# Patient Record
Sex: Female | Born: 1985 | Race: White | Hispanic: No | Marital: Married | Smoking: Former smoker
Health system: Southern US, Community
[De-identification: ages and names within clinical notes are randomized; demographics above are authoritative.]

## PROBLEM LIST (undated history)

## (undated) ENCOUNTER — Inpatient Hospital Stay (HOSPITAL_COMMUNITY): Payer: Self-pay

## (undated) DIAGNOSIS — Z79899 Other long term (current) drug therapy: Secondary | ICD-10-CM

## (undated) DIAGNOSIS — G43909 Migraine, unspecified, not intractable, without status migrainosus: Secondary | ICD-10-CM

## (undated) DIAGNOSIS — F1120 Opioid dependence, uncomplicated: Secondary | ICD-10-CM

## (undated) DIAGNOSIS — Z5181 Encounter for therapeutic drug level monitoring: Secondary | ICD-10-CM

## (undated) HISTORY — DX: Other long term (current) drug therapy: Z79.899

## (undated) HISTORY — PX: BREAST SURGERY: SHX581

## (undated) HISTORY — DX: Opioid dependence, uncomplicated: F11.20

## (undated) HISTORY — DX: Encounter for therapeutic drug level monitoring: Z51.81

## (undated) HISTORY — DX: Migraine, unspecified, not intractable, without status migrainosus: G43.909

---

## 2007-08-18 ENCOUNTER — Emergency Department (HOSPITAL_COMMUNITY): Admission: EM | Admit: 2007-08-18 | Discharge: 2007-08-18 | Payer: Self-pay | Admitting: Emergency Medicine

## 2008-06-13 LAB — CONVERTED CEMR LAB: Pap Smear: NORMAL

## 2008-11-05 ENCOUNTER — Ambulatory Visit: Payer: Self-pay | Admitting: Family Medicine

## 2008-11-29 ENCOUNTER — Encounter: Payer: Self-pay | Admitting: Family Medicine

## 2008-12-17 ENCOUNTER — Encounter (INDEPENDENT_AMBULATORY_CARE_PROVIDER_SITE_OTHER): Payer: Self-pay | Admitting: Plastic Surgery

## 2008-12-17 ENCOUNTER — Ambulatory Visit (HOSPITAL_BASED_OUTPATIENT_CLINIC_OR_DEPARTMENT_OTHER): Admission: RE | Admit: 2008-12-17 | Discharge: 2008-12-17 | Payer: Self-pay | Admitting: Plastic Surgery

## 2008-12-17 ENCOUNTER — Encounter: Payer: Self-pay | Admitting: Family Medicine

## 2009-05-09 ENCOUNTER — Ambulatory Visit: Payer: Self-pay | Admitting: Family Medicine

## 2009-05-09 DIAGNOSIS — Z9189 Other specified personal risk factors, not elsewhere classified: Secondary | ICD-10-CM | POA: Insufficient documentation

## 2009-05-09 DIAGNOSIS — R519 Headache, unspecified: Secondary | ICD-10-CM | POA: Insufficient documentation

## 2009-05-09 DIAGNOSIS — F17200 Nicotine dependence, unspecified, uncomplicated: Secondary | ICD-10-CM

## 2009-05-09 DIAGNOSIS — R51 Headache: Secondary | ICD-10-CM

## 2009-06-09 ENCOUNTER — Ambulatory Visit: Payer: Self-pay | Admitting: Family Medicine

## 2009-06-09 DIAGNOSIS — G479 Sleep disorder, unspecified: Secondary | ICD-10-CM

## 2009-09-25 ENCOUNTER — Telehealth: Payer: Self-pay | Admitting: Family Medicine

## 2009-10-08 ENCOUNTER — Telehealth: Payer: Self-pay | Admitting: Family Medicine

## 2009-10-10 ENCOUNTER — Ambulatory Visit: Payer: Self-pay | Admitting: Family Medicine

## 2010-04-14 NOTE — Progress Notes (Signed)
Summary: migraines   Phone Note Call from Patient Call back at Home Phone [phone]   Caller: Patient Call For: Hannah Beat MD Summary of Call: Patient was seen back in march with having migraines. She was given niproxen. Patient said that it is not helping at all, still having headaches almost daily. She wants to know if she could have something else called in to [facility].  Initial call taken by: Melody Comas,  September 25, 2009 10:48 AM  Follow-up for Phone Call        Appears she was also given amitryptiline for HA as well (a daily med)  OK to call in below  follow-up next week if not getting better Follow-up by: Hannah Beat MD,  September 25, 2009 2:07 PM    New/Updated Medications: BUTALBITAL-APAP-CAFFEINE 50-325-40 MG TABS (BUTALBITAL-APAP-CAFFEINE) 2 by mouth at onset of headache, then 1 by mouth q 4 hours, max 8/24 hours Prescriptions: BUTALBITAL-APAP-CAFFEINE 50-325-40 MG TABS (BUTALBITAL-APAP-CAFFEINE) 2 by mouth at onset of headache, then 1 by mouth q 4 hours, max 8/24 hours  #30 x 0   Entered by:   Benny Lennert CMA (AAMA)   Authorized by:   Hannah Beat MD   Signed by:   Benny Lennert CMA (AAMA) on 09/25/2009   Method used:   [facility]        [facility]  [address] [id number]       [address]       [place], [place]  [address]       Ph: [phone] or [phone]       Fax: [phone]   RxID:   [id number] BUTALBITAL-APAP-CAFFEINE 50-325-40 MG TABS (BUTALBITAL-APAP-CAFFEINE) 2 by mouth at onset of headache, then 1 by mouth q 4 hours, max 8/24 hours  #30 x 0   Entered and Authorized by:   Hannah Beat MD   Signed by:   Hannah Beat MD on 09/25/2009   Method used:   Telephoned to ...         RxID:   [id number]

## 2010-04-14 NOTE — Assessment & Plan Note (Signed)
Summary: COUGH, EARS HURTING   Vital Signs:  Patient profile:   25 year old female Height:      63 inches Weight:      164.75 pounds BMI:     29.29 Temp:     98.9 degrees F oral Pulse rate:   64 / minute Pulse rhythm:   regular Resp:     20 per minute BP sitting:   116 / 74  (left arm) Cuff size:   regular  Vitals Entered By: Lewanda Rife LPN (May 09, 2009 3:47 PM) 164  History of Present Illness: sick for a week  started up with bad st and cough now ears are bothering her - painful and full no fever - no chills or aches  both runny and stuffy nose with yellow d/c   is coughing up yellow stuff is a smoker is wheezing and no inhalers    no n/v/d   has been having some headaches all over -- going on for a while too  is both sides of her head about every day -- takes tylenol  going on for a couple of months  no hx of migraines   had breast reduction -- Dr Jenelle Mages  has one more f/u with her for f/u    Allergies (verified): No Known Drug Allergies  Past History:  Past Medical History: Last updated: 11/05/2008 Urinary tract infections  Past Surgical History: Last updated: 11/05/2008 none  Family History: Last updated: 11/05/2008 Family History of Alcoholism/Addiction Family History of Arthritis Family History Depression Family History Hypertension  Social History: Last updated: 11/05/2008 Occupation:nursing student Single Current Smoker Alcohol use-yes Drug use-no Regular exercise-no  Risk Factors: Exercise: no (11/05/2008)  Risk Factors: Smoking Status: current (11/05/2008)  Review of Systems General:  Complains of fatigue and malaise; denies chills and fever. Eyes:  Denies blurring, discharge, and eye irritation. ENT:  Complains of nasal congestion, postnasal drainage, and sore throat. CV:  Denies chest pain or discomfort and palpitations. Resp:  Complains of cough and sputum productive; denies shortness of breath and  wheezing. GI:  Denies abdominal pain, bloody stools, and change in bowel habits. Derm:  Denies itching, lesion(s), poor wound healing, and rash. Neuro:  Complains of headaches; denies falling down, numbness, poor balance, sensation of room spinning, tingling, visual disturbances, and weakness. Psych:  Denies anxiety and depression. Endo:  Denies excessive thirst and excessive urination.  Physical Exam  General:  fatigued appearing  Head:  normocephalic, atraumatic, and no abnormalities observed.  no sinus tenderness  Eyes:  vision grossly intact, pupils equal, pupils round, and pupils reactive to light.  mild conj injection without d/c  Ears:  L ear erythema and bulging  R ear - effusion   Nose:  nares congested and injected  Mouth:  pharynx pink and moist, no erythema, and no exudates.   Neck:  No deformities, masses, or tenderness noted. Lungs:  CTA with harsh bs at bases  some rhonchi but no rales or crackles scant exp wheeze at bases worse on forced exp Heart:  Normal rate and regular rhythm. S1 and S2 normal without gallop, murmur, click, rub or other extra sounds. Abdomen:  Bowel sounds positive,abdomen soft and non-tender without masses, organomegaly or hernias noted. Neurologic:  cranial nerves II-XII intact, sensation intact to light touch, gait normal, and DTRs symmetrical and normal.   Skin:  Intact without suspicious lesions or rashes Cervical Nodes:  No lymphadenopathy noted Psych:  fatigued but nl affect   Impression & Recommendations:  Problem # 1:  OTITIS MEDIA, ACUTE (ICD-382.9) Assessment New on L with ETD on R / s/p uri also with some bronchitis in smoker will cover with augmentin (did adv to use back up preg prev with this in light of interaction with OC and pt voiced understanding) fluid/rest and robitussin ac for cough with caution for sedation  update if worse or not imp in 1 week adv to work on smoking cessation Her updated medication list for this  problem includes:    Augmentin 875-125 Mg Tabs (Amoxicillin-pot clavulanate) .Marland Kitchen... 1 by mouth two times a day for 10 days for ear infection and bronchitis  Problem # 2:  BRONCHITIS- ACUTE (ICD-466.0) Assessment: New see above- in assoc with uri in a smoker -- cover with augmentin also mdi albuterol as needed wheeze - need to watch for worse reactive airways and rev red flags  adv to quit smoking  see above for coverage  Her updated medication list for this problem includes:    Augmentin 875-125 Mg Tabs (Amoxicillin-pot clavulanate) .Marland Kitchen... 1 by mouth two times a day for 10 days for ear infection and bronchitis    Guaifenesin-codeine 100-10 Mg/63ml Syrp (Guaifenesin-codeine) .Marland Kitchen... 1-2 teaspoons by mouth up to every 4-6 hours as needed cough    Proair Hfa 108 (90 Base) Mcg/act Aers (Albuterol sulfate) .Marland Kitchen... 2 puffs up to every 4 hours as needed wheezing or tight chest  Problem # 3:  TOBACCO USE (ICD-305.1) Assessment: Comment Only discussed in detail risks of smoking, and possible outcomes including COPD, vascular dz, cancer and also respiratory infections/sinus problems  pt voiced understanding  did adv to quit   Problem # 4:  REDUCTION MAMMOPLASTY, HX OF (ICD-V15.9) Assessment: Comment Only ref to surgeon for 6 mo re check- doing very well  Orders: Surgical Referral (Surgery)  Problem # 5:  HEADACHE, CHRONIC (ICD-784.0) Assessment: New did not have time for full ha workup today- but disc imp of regular sleep /water/ limit caff and analgesics could be rel to uri also disc poss of change in birth control being trigger- adv to f/u with gyn to disc going back on her prev pill lo ovrol enc to f/u if this does not help so headache can be eval further   Complete Medication List: 1)  Loestrin 24 Fe 1-20 Mg-mcg Tabs (Norethin ace-eth estrad-fe) .... Take one daily 2)  Augmentin 875-125 Mg Tabs (Amoxicillin-pot clavulanate) .Marland Kitchen.. 1 by mouth two times a day for 10 days for ear infection and  bronchitis 3)  Guaifenesin-codeine 100-10 Mg/84ml Syrp (Guaifenesin-codeine) .Marland Kitchen.. 1-2 teaspoons by mouth up to every 4-6 hours as needed cough 4)  Proair Hfa 108 (90 Base) Mcg/act Aers (Albuterol sulfate) .... 2 puffs up to every 4 hours as needed wheezing or tight chest  Patient Instructions: 1)  drink lots of fluids 2)  take the augmentin as directed 3)  consider quitting smoking 4)  use the proair inhaler as needed for wheeze or tight chest  5)  use the cough syrup with caution because it can sedate  6)  update me if worse or not improving in a week  7)  call your gyn about switching back to old birth control pill in light of headaches  Prescriptions: PROAIR HFA 108 (90 BASE) MCG/ACT AERS (ALBUTEROL SULFATE) 2 puffs up to every 4 hours as needed wheezing or tight chest  #1 mdi x 1   Entered and Authorized by:   Judith Part MD   Signed by:   [employer]  Rose Fillers MD on 05/09/2009   Method used:   Print then Give to Patient   RxID:   [id number] GUAIFENESIN-CODEINE 100-10 MG/5ML SYRP (GUAIFENESIN-CODEINE) 1-2 teaspoons by mouth up to every 4-6 hours as needed cough  #120cc x 0   Entered and Authorized by:   Judith Part MD   Signed by:   Judith Part MD on 05/09/2009   Method used:   Print then Give to Patient   RxID:   [id number] AUGMENTIN 875-125 MG TABS (AMOXICILLIN-POT CLAVULANATE) 1 by mouth two times a day for 10 days for ear infection and bronchitis  #20 x 0   Entered and Authorized by:   Judith Part MD   Signed by:   Judith Part MD on 05/09/2009   Method used:   Print then Give to Patient   RxID:   [id number]   Current Allergies (reviewed today): No known allergies

## 2010-04-14 NOTE — Op Note (Signed)
Summary: Breast Reduction/[facility]  Breast Reduction/[facility]   Imported By: Lanelle Bal 05/15/2009 11:15:50  _____________________________________________________________________  External Attachment:    Type:   Image     Comment:   External Document

## 2010-04-14 NOTE — Letter (Signed)
Summary: Page 1 Only/[facility]  Page 1 Only/[facility]   Imported By: Lanelle Bal 05/15/2009 11:05:34  _____________________________________________________________________  External Attachment:    Type:   Image     Comment:   External Document

## 2010-04-14 NOTE — Assessment & Plan Note (Signed)
Summary: MIGRAINE HA/CLE   Vital Signs:  Patient profile:   25 year old female Height:      63 inches Weight:      164.2 pounds BMI:     29.19 Temp:     98.5 degrees F oral Pulse rate:   72 / minute Pulse rhythm:   regular BP sitting:   110 / 70  (left arm) Cuff size:   regular  Vitals Entered By: Benny Lennert CMA Duncan Dull) (October 10, 2009 9:36 AM)  History of Present Illness: Chief complaint Migraine Headache  25 year old female:  Ha almost every day. For about 8 months. Some days will be a whole lt days that others.   took some  light bothers home - the fiorecet.     Headache HPI:      The patient comes in for chronic management of headaches which have been unstable.  Since the last visit, the frequency of headaches have increased, but the intensity of the headaches have not changed.  The headaches will last anywhere from 2 hours to 3 days at a time.  She has approximately 5+ headaches per month.  There is a family history of migraine headaches.        The location of the headaches are bilateral.  Headache quality is throbbing or pulsating.  Precipitating factors consist of stress.  The headaches are associated with nausea, photophobia, and phonophobia.        Positive alarm features include change in frequency from prior H/A's.  The patient denies first or worst H/A of life, change in features from prior H/A's, mylagia, fever, malaise, weight loss, scalp tenderness, focal neurologic symptoms, confusion, seizures, and impaired level of consciousness.         Headache Treatment History:      NSAID's and tricyclic antidepressants were tried but not effective.  She has tried acetaminophen-plain, ASA-plain, ASA-butalbital-caffeine, naproxen, ibuprofen, and other NSAID which were ineffective.    Allergies (verified): No Known Drug Allergies  Past History:  Past medical, surgical, family and social histories (including risk factors) reviewed, and no changes noted (except as noted  below).  Past Medical History: Reviewed history from 06/09/2009 and no changes required. Urinary tract infections migraine with chronic daily headache   Past Surgical History: Reviewed history from 11/05/2008 and no changes required. none  Family History: Reviewed history from 06/09/2009 and no changes required. Family History of Alcoholism/Addiction Family History of Arthritis Family History Depression Family History Hypertension great aunt - lung cancer   Social History: Reviewed history from 11/05/2008 and no changes required. Occupation:nursing student Single Current Smoker Alcohol use-yes Drug use-no Regular exercise-no  Review of Systems      See HPI General:  Denies chills, fatigue, and fever.  Physical Exam  General:  GEN: Well-developed,well-nourished,in no acute distress; alert,appropriate and cooperative throughout examination HEENT: Normocephalic and atraumatic without obvious abnormalities. No apparent alopecia or balding. Ears, externally no deformities PULM: Breathing comfortably in no respiratory distress EXT: No clubbing, cyanosis, or edema PSYCH: Normally interactive. Cooperative during the interview. Pleasant. Friendly and conversant. Not anxious or depressed appearing. Normal, full affect.    Detailed Neurologic Exam  Speech:    Speech is normal; fluent and spontaneous with normal comprehension Cognition:    The patient is oriented to person, place, and time; memory intact; language fluent; normal attention, concentration, and fund of knowledge Cranial Nerves:    The pupils are equal, round, and reactive to light. The fundi are normal and spontaneous  venous pulsations are present. Visual fields are full to finger confrontation. Extraocular movements are intact. Trigeminal sensation is intact and the muscles of mastication are normal. The face is symmetric. The palate elevates in the midline. Voice is normal. Shoulder shrug is normal. The tongue has  normal motion without fasciculations.  Coordination:    Normal finger to nose and heel to shin. Normal rapid alternating movements.  Gait:    Heel-toe and tandem gait are normal.  Trapezius:    No tightness or tenderness noted.  Observation:    No asymmetry, no atrophy, and no involuntary movements noted.   Tone:    Normal muscle tone.  Posture:    Posture is normal.  Strength:    Strength is V/V in the upper and lower limbs.    Impression & Recommendations:  Problem # 1:  HEADACHE, CHRONIC (ICD-784.0) mixed component of headache, likely migraine by history. Phonophobia and photophobia. However the she is having daily headaches, and a component of analgesic rebound is likely. I've asked her to titrate off of her emergency medications.  Failure of try cyclic antidepressant. Start beta blocker. Also start riboflavin.  The following medications were removed from the medication list:    Naprosyn 500 Mg Tabs (Naproxen) .Marland Kitchen... 1 by mouth up to two times a day with food as needed headache Her updated medication list for this problem includes:    Butalbital-apap-caffeine 50-325-40 Mg Tabs (Butalbital-apap-caffeine) .Marland Kitchen... 2 by mouth at onset of headache, then 1 by mouth q 4 hours, max 8/24 hours    Propranolol Hcl 40 Mg Tabs (Propranolol hcl) .Marland Kitchen... 1 by mouth two times a day    Imitrex 100 Mg Tabs (Sumatriptan succinate) .Marland Kitchen... 1 by mouth at the onset of migraine. may repeat dose in one hour if headache not resolved  Problem # 2:  SLEEP DISORDER (ICD-780.50) Assessment: Improved primarily due to #1  Complete Medication List: 1)  Butalbital-apap-caffeine 50-325-40 Mg Tabs (Butalbital-apap-caffeine) .... 2 by mouth at onset of headache, then 1 by mouth q 4 hours, max 8/24 hours 2)  Propranolol Hcl 40 Mg Tabs (Propranolol hcl) .Marland Kitchen.. 1 by mouth two times a day 3)  Imitrex 100 Mg Tabs (Sumatriptan succinate) .Marland Kitchen.. 1 by mouth at the onset of migraine. 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 May repeat dose in one hour if headache not resolved  #10 x 3   Entered and Authorized by:   Hannah Beat MD   Signed by:   Hannah Beat MD on 10/10/2009   Method used:   Print then Give to Patient   RxID:   [id number] PROPRANOLOL HCL 40 MG TABS (PROPRANOLOL HCL) 1 by mouth two times a day  #60 x 5   Entered and Authorized by:   Hannah Beat MD   Signed by:   Hannah Beat MD on 10/10/2009   Method used:   Print then Give to Patient   RxID:   [id number]   Current Allergies (reviewed today): No known allergies

## 2010-04-14 NOTE — Assessment & Plan Note (Signed)
Summary: migraines/ alc   Vital Signs:  Patient profile:   25 year old female Height:      63 inches Weight:      169.75 pounds BMI:     30.18 Temp:     99.1 degrees F oral Pulse rate:   80 / minute Pulse rhythm:   regular BP sitting:   116 / 82  (left arm) Cuff size:   regular  Vitals Entered By: Lewanda Rife LPN (June 09, 2009 10:21 AM) CC: Wants to discuss migraine h/a and trouble sleeping. No h/a today   History of Present Illness: last time we talked about headaches -- disc OC  has not been taking her OC for 2 mo -- and headaches are still bad  also not sleeping well for 4-5 month   no real increase in stress  diet not too bad  no caffine  exercise -- runs 2-3 per week -- 11/2 miles   having headaches almost every day  all over - esp in forehead very light sensitive  is bilateral  not sound sensitive  no aura at all  it throbs , sometimes worse with exertion   not worse around menses at all   no particular trigger   usually start when she wakes up - lasts all day  get 8/10 pain scale -- has had to leave work tylenol does not help  no other meds   no vision problems at all  is good about water intake generally    no chance pregnant at all   no migraines in family   Allergies (verified): No Known Drug Allergies  Past History:  Past Surgical History: Last updated: 11/05/2008 none  Family History: Last updated: 06/09/2009 Family History of Alcoholism/Addiction Family History of Arthritis Family History Depression Family History Hypertension great aunt - lung cancer   Social History: Last updated: 11/05/2008 Occupation:nursing student Single Current Smoker Alcohol use-yes Drug use-no Regular exercise-no  Risk Factors: Exercise: no (11/05/2008)  Risk Factors: Smoking Status: current (11/05/2008)  Past Medical History: Urinary tract infections migraine with chronic daily headache   Family History: Family History of  Alcoholism/Addiction Family History of Arthritis Family History Depression Family History Hypertension great aunt - lung cancer   Review of Systems General:  Complains of fatigue; denies fever, loss of appetite, malaise, and weight loss. Eyes:  Denies blurring and eye irritation. CV:  Denies chest pain or discomfort, lightheadness, palpitations, and shortness of breath with exertion. Resp:  Denies cough, shortness of breath, and wheezing. GI:  Denies abdominal pain, bloody stools, change in bowel habits, nausea, and vomiting. GU:  Denies dysuria and urinary frequency. MS:  Denies joint pain, muscle aches, cramps, and stiffness. Derm:  Denies itching, lesion(s), poor wound healing, and rash. Neuro:  Complains of headaches; denies difficulty with concentration, disturbances in coordination, falling down, memory loss, numbness, poor balance, sensation of room spinning, tingling, tremors, visual disturbances, and weakness. Psych:  Denies anxiety and depression. Endo:  Denies cold intolerance, excessive thirst, excessive urination, and heat intolerance. Heme:  Denies abnormal bruising and bleeding. Allergy:  Denies seasonal allergies.  Physical Exam  General:  fatigued appearing  Head:  normocephalic, atraumatic, and no abnormalities observed.  no sinus tenderness  Eyes:  vision grossly intact, pupils equal, pupils round, and pupils reactive to light.  fundi grossly wnl no nystagmus  Ears:  R ear normal and L ear normal.   Nose:  no nasal discharge.   Mouth:  pharynx pink and moist, no  erythema, and no exudates.   Neck:  supple with full rom and no masses or thyromegally, no JVD or carotid bruit  Chest Wall:  No deformities, masses, or tenderness noted. Lungs:  diffusely distant bs - CTA Heart:  Normal rate and regular rhythm. S1 and S2 normal without gallop, murmur, click, rub or other extra sounds. Abdomen:  Bowel sounds positive,abdomen soft and non-tender without masses, organomegaly  or hernias noted. Msk:  No deformity or scoliosis noted of thoracic or lumbar spine.  no acute joint changes  Extremities:  No clubbing, cyanosis, edema, or deformity noted with normal full range of motion of all joints.   Neurologic:  alert & oriented X3, cranial nerves II-XII intact, strength normal in all extremities, sensation intact to light touch, gait normal, DTRs symmetrical and normal, finger-to-nose normal, toes down bilaterally on Babinski, and Romberg negative.   Skin:  Intact without suspicious lesions or rashes Cervical Nodes:  No lymphadenopathy noted Psych:  seems fatigued but pleasant not seemingly depressed or anxious good eye contact and comm skills   Impression & Recommendations:  Problem # 1:  HEADACHE, CHRONIC (ICD-784.0) Assessment Deteriorated chronic daily headache suspect migraine- may be some tylenol analgesic rebound  many features of migraine - not hormonal  also insomnia will try elavil- slow taper up for sleep and ha-- disc poss side eff incl SI or dep- will alert me  disc lifestyle change and given handout from [facility] as well naproxen for acute headache with food- may try tryptan in future  update if worse f/u in 1 mo  Her updated medication list for this problem includes:    Naprosyn 500 Mg Tabs (Naproxen) .Marland Kitchen... 1 by mouth up to two times a day with food as needed headache  Problem # 2:  SLEEP DISORDER (ICD-780.50) Assessment: New disc sleep hygiene and exercise trial of elavil- see above- with gradual inc in dose as tol  f/u 1 mo   Problem # 3:  TOBACCO USE (ICD-305.1) Assessment: Unchanged discussed in detail risks of smoking, and possible outcomes including COPD, vascular dz, cancer and also respiratory infections/sinus problems   also added this may worsen headache  pt voiced understanding  she wants to try to quit   Complete Medication List: 1)  Proair Hfa 108 (90 Base) Mcg/act Aers (Albuterol sulfate) .... 2 puffs up to every 4 hours as  needed wheezing or tight chest 2)  Amitriptyline Hcl 10 Mg Tabs (Amitriptyline hcl) .... Take 3 by mouth at bedtime 3)  Naprosyn 500 Mg Tabs (Naproxen) .Marland Kitchen.. 1 by mouth up to two times a day with food as needed headache  Patient Instructions: 1)  start amitriptyline 1 pill each bedtime for 1 week 2)  then 2 pills each bedtme for 1 week 3)  then increase to 3 pills each bedtime 4)  at any time if this makes you too sedated -- stay at the lower dose  5)  this should help sleep and headaches 6)  try the naproxen for headaches  7)  be good with your water intake  8)  avoid alcohol 9)  work on smoking  10)  follow up with me in about a month  Prescriptions: NAPROSYN 500 MG TABS (NAPROXEN) 1 by mouth up to two times a day with food as needed headache  #60 x 1   Entered and Authorized by:   Judith Part MD   Signed by:   Judith Part MD on 06/09/2009   Method used:  [facility]        [facility]  [address] [facility]       [address]       [place], [place]  [address]       Ph: [phone] or [phone]       Fax: [phone]   RxID:   [id number] AMITRIPTYLINE HCL 10 MG TABS (AMITRIPTYLINE HCL) take 3 by mouth at bedtime  #90 x 2   Entered and Authorized by:   Judith Part MD   Signed by:   Judith Part MD on 06/09/2009   Method used:   [facility]        [facility]  [address] (retail)       [address]       [place], [place]  [address]       Ph: [phone] or [phone]       Fax: [phone]   RxID:   [id number]   Current Allergies (reviewed today): No known allergies

## 2010-04-14 NOTE — Progress Notes (Signed)
Summary: refill request for butalbital  Phone Note Refill Request Message from:  Fax from Pharmacy  Refills Requested: Medication #1:  BUTALBITAL-APAP-CAFFEINE 50-325-40 MG TABS 2 by mouth at onset of headache   Last Refilled: 09/25/2009 [facility] [address], [phone].  Initial call taken by: Lowella Petties CMA,  October 08, 2009 11:37 AM  Follow-up for Phone Call        ok #30  needs follow-up please schedule, should not be taking this much. Follow-up by: Hannah Beat MD,  October 08, 2009 11:45 AM  Additional Follow-up for Phone Call Additional follow up Details #1::        rx called to pharmacy and patient advised to make follow up appt for headaches Additional Follow-up by: Benny Lennert CMA Duncan Dull),  October 09, 2009 11:10 AM    Prescriptions: BUTALBITAL-APAP-CAFFEINE 50-325-40 MG TABS (BUTALBITAL-APAP-CAFFEINE) 2 by mouth at onset of headache, then 1 by mouth q 4 hours, max 8/24 hours  #30 x 0   Entered by:   Benny Lennert CMA (AAMA)   Authorized by:   Hannah Beat MD   Signed by:   Benny Lennert CMA (AAMA) on 10/09/2009   Method used:   [facility]        [facility]  [address] (retail)       [address]       [place], [place]  [address]       Ph: [phone] or [phone]       Fax: [phone]   RxID:   [id number]

## 2010-06-18 LAB — POCT HEMOGLOBIN-HEMACUE: Hemoglobin: 14 g/dL (ref 12.0–15.0)

## 2011-06-23 ENCOUNTER — Other Ambulatory Visit: Payer: Self-pay | Admitting: Obstetrics and Gynecology

## 2011-10-13 ENCOUNTER — Encounter: Payer: Self-pay | Admitting: Family Medicine

## 2011-10-13 ENCOUNTER — Ambulatory Visit (INDEPENDENT_AMBULATORY_CARE_PROVIDER_SITE_OTHER): Admitting: Family Medicine

## 2011-10-13 VITALS — BP 100/68 | HR 68 | Temp 98.6°F | Wt 159.0 lb

## 2011-10-13 DIAGNOSIS — N912 Amenorrhea, unspecified: Secondary | ICD-10-CM

## 2011-10-13 DIAGNOSIS — Z3490 Encounter for supervision of normal pregnancy, unspecified, unspecified trimester: Secondary | ICD-10-CM

## 2011-10-13 DIAGNOSIS — Z331 Pregnant state, incidental: Secondary | ICD-10-CM

## 2011-10-13 LAB — POCT URINE PREGNANCY: Preg Test, Ur: POSITIVE

## 2011-10-13 MED ORDER — PRENATAL VITAMINS (DIS) PO TABS: ORAL_TABLET | ORAL | Status: DC

## 2011-10-13 NOTE — Patient Instructions (Addendum)
REFERRAL: GO THE THE FRONT ROOM AT THE ENTRANCE OF OUR CLINIC, NEAR CHECK IN. ASK FOR MARION. SHE WILL HELP YOU SET UP YOUR REFERRAL. DATE: TIME:  

## 2011-10-13 NOTE — Progress Notes (Signed)
   Nature conservation officer at [facility] [address] [place] [address] Phone: [phone] Fax: [phone]  Date:  10/13/2011   Name:  Darlene Baker   DOB:  1986/02/23   MRN:  [id number]  PCP:  Hannah Beat, MD    Chief Complaint: Amenorrhea and Positive home pregnancy test, needs referral to OB   History of Present Illness:  Darlene Baker is a 26 y.o. very pleasant female patient who presents with the following:  LMP 08/22/2011. 1st pregnancy.  Already sees Dr. 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   Prenatal vitamins Dr. Duane Lope at [facility]  Past Medical History, Surgical History, Social History, Family History, Problem List, Medications, and Allergies have been reviewed and updated if relevant.  No current outpatient prescriptions on file prior to visit.    Review of Systems:  GEN: No acute illnesses, no fevers, chills. GI: No n/v/d, eating normally - minimal nausea at this point Pulm: No SOB Interactive and getting along well at home.  Otherwise, ROS is as per the HPI.   Physical Examination: Filed Vitals:   10/13/11 1609  BP: 100/68  Pulse: 68  Temp: 98.6 F (37 C)   Filed Vitals:   10/13/11 1609  Weight: 159 lb (72.122 kg)   There is no height on file to calculate BMI. Ideal Body Weight:     GEN: WDWN, NAD, Non-toxic, A & O x 3 HEENT: Atraumatic, Normocephalic. Neck supple. No masses, No LAD. Ears and Nose: No external deformity. CV: RRR, No M/G/R. No JVD. No thrill. No extra heart sounds. PULM: CTA B, no wheezes, crackles, rhonchi. No retractions. No resp. distress. No accessory muscle use. EXTR: No c/c/e NEURO Normal gait.  PSYCH: Normally interactive. Conversant. Not depressed or anxious appearing.  Calm demeanor.    Assessment and Plan: 1. Amenorrhea  POCT urine pregnancy  2. Pregnancy  Ambulatory referral to Obstetrics / Gynecology    Lab Results    Component Value Date   PREGTESTUR Positive 10/13/2011   Consult [facility] PNV written for  Hannah Beat, MD

## 2011-11-03 ENCOUNTER — Other Ambulatory Visit: Payer: Self-pay

## 2011-12-13 ENCOUNTER — Other Ambulatory Visit (HOSPITAL_COMMUNITY): Payer: Self-pay | Admitting: Obstetrics & Gynecology

## 2011-12-13 DIAGNOSIS — Z3689 Encounter for other specified antenatal screening: Secondary | ICD-10-CM

## 2011-12-13 DIAGNOSIS — R772 Abnormality of alphafetoprotein: Secondary | ICD-10-CM

## 2011-12-13 DIAGNOSIS — IMO0001 Reserved for inherently not codable concepts without codable children: Secondary | ICD-10-CM

## 2011-12-16 ENCOUNTER — Other Ambulatory Visit (HOSPITAL_COMMUNITY): Payer: Self-pay | Admitting: Obstetrics & Gynecology

## 2011-12-16 ENCOUNTER — Ambulatory Visit (HOSPITAL_COMMUNITY)
Admission: RE | Admit: 2011-12-16 | Discharge: 2011-12-16 | Disposition: A | Discharge status: Home / Self Care | Source: Ambulatory Visit | Attending: Obstetrics & Gynecology | Admitting: Obstetrics & Gynecology

## 2011-12-16 ENCOUNTER — Ambulatory Visit (HOSPITAL_COMMUNITY): Admission: RE | Admit: 2011-12-16 | Source: Ambulatory Visit

## 2011-12-16 ENCOUNTER — Encounter (HOSPITAL_COMMUNITY): Payer: Self-pay

## 2011-12-16 VITALS — BP 111/72 | HR 67 | Wt 158.2 lb

## 2011-12-16 DIAGNOSIS — IMO0001 Reserved for inherently not codable concepts without codable children: Secondary | ICD-10-CM

## 2011-12-16 DIAGNOSIS — O99330 Smoking (tobacco) complicating pregnancy, unspecified trimester: Secondary | ICD-10-CM | POA: Insufficient documentation

## 2011-12-16 DIAGNOSIS — Z3689 Encounter for other specified antenatal screening: Secondary | ICD-10-CM

## 2011-12-16 DIAGNOSIS — IMO0002 Reserved for concepts with insufficient information to code with codable children: Secondary | ICD-10-CM

## 2011-12-16 DIAGNOSIS — R772 Abnormality of alphafetoprotein: Secondary | ICD-10-CM

## 2011-12-16 DIAGNOSIS — Z1389 Encounter for screening for other disorder: Secondary | ICD-10-CM | POA: Insufficient documentation

## 2011-12-16 DIAGNOSIS — O30039 Twin pregnancy, monochorionic/diamniotic, unspecified trimester: Secondary | ICD-10-CM

## 2011-12-16 DIAGNOSIS — O358XX0 Maternal care for other (suspected) fetal abnormality and damage, not applicable or unspecified: Secondary | ICD-10-CM | POA: Insufficient documentation

## 2011-12-16 DIAGNOSIS — Z363 Encounter for antenatal screening for malformations: Secondary | ICD-10-CM | POA: Insufficient documentation

## 2011-12-16 DIAGNOSIS — O30009 Twin pregnancy, unspecified number of placenta and unspecified number of amniotic sacs, unspecified trimester: Secondary | ICD-10-CM | POA: Insufficient documentation

## 2011-12-16 NOTE — Progress Notes (Signed)
[facility] ultrasound  Indication: 26 yr old G1P0 at [redacted]w[redacted]d with monochorionic/diamniotic twin gestation and elevated MSAFP for ultrasound.  Findings: 1. Monochorionic/diamniotic twin gestation; the dividing membrane is seen. 2. Anterior placenta without evidence of previa. 3. Normal amniotic fluid volume for both fetuses; however the maximum vertical pocket of amniotic fluid for twin A is 2.64 (slightly decreased); and 6.2cm for twin B (slightly increased). 4. Normal transvaginal cervical length. 5. There is an abdominal wall defect in twin A consistent with gastroschisis. 6. The remainder of the limited anatomy survey for both fetuses is normal; the anatomic survey is limited by early gestational age. Normal stomach and bladder seen in each fetus. 7. Normal umbilical artery Doppler studies for both fetuses.  Recommendations:  1. Monochorionic/diamniotic twin pregnancy: see consult letter. No evidence of twin twin transfusion syndrome at this time; however there is concern for possible development given discordant amniotic fluid volume. Recommend fetal growth every 4 weeks and ultrasounds at least every 2 weeks to evaluate for twin twin transfusion syndrome. However given discordant amniotic fluid volumes at this time recommend follow up ultrasound on 12/21/11. If fluid volumes improve or remain stable can evaluate weekly and then may be able to space to every 2 weeks if remains normal. Recommend transvaginal cervical lengths every 2 weeks. Recommend preterm labor precautions, recommend antenatal testing starting at 32 weeks, recommend delivery by [redacted] weeks gestation. See consult for further recommendations.  2. Twin A gastroschisis: discussed this finding is likely the etiology of the elevated MSAFP. See consult letter. Recommend fetal growth every 4 weeks, recommend antenatal testing starting at 32 weeks, recommend antenatal consult with Pediatric Surgery- referral made.  3. Follow up ultrasound on  12/21/11.  Results given to Dr. Marzetta Board nurse. Eulis Foster, MD

## 2011-12-16 NOTE — Progress Notes (Signed)
MFM consult  26 yr old G1P0 at [redacted]w[redacted]d with monochorionic/diamniotic twin gestation and elevated MSAFP referred by Dr. Arlyce Dice for ultrasound and consultation.    Ultrasound today shows: monochorionic/diamniotic twin gestation; the dividing membrane is seen. Anterior placenta without evidence of previa.  Normal amniotic fluid volume for both fetuses; however the maximum vertical pocket of amniotic fluid for twin A is 2.64 (slightly decreased); and 6.2cm for twin B (slightly increased). Normal transvaginal cervical length.  There is an abdominal wall defect in twin A consistent with gastroschisis. The remainder of the limited anatomy survey for both fetuses is normal; the anatomic survey is limited by early gestational age. Normal stomach and bladder seen in each fetus.  Normal umbilical artery Doppler studies for both fetuses.    I discussed the findings of the ultrasound with the patient and counseled her as follows:  1. Monochorionic/diamniotic twin pregnancy:   I discussed the following increased risks with monochorionic/diamniotic twin gestation: - risk of preterm delivery: discussed risk of delivering prior to 37 weeks is approximately 60%; risk of delivering prior to 32 weeks is approximately 11%-  Preterm delivery may be spontaneous or iatrogenic from complications of pregnancy - I recommend close monitoring for the development of signs/symptoms of preterm labor/PPROM - recommend serial cervical length surveillance every 2-4 weeks  -  Increased risk of gestational diabetes: recommend screening at 24 weeks - Discussed increased risk of preeclampsia: recommend close surveillance for the development of signs/symptoms of preeclampsia in the 2nd and 3rd trimester - Discussed the increased risk of requiring a Cesarean delivery -  Discussed risk of fetal growth restriction: recommend serial fetal growth ultrasounds every 2-4 weeks -  Discussed increased risk of congenital anomalies of approximately  3-6%: recommend complete fetal anatomic survey at 18-[redacted] weeks gestation; gastroschisis seen in twin A today -  Patient had normal first trimester screen; discussed limitations of screening tests in detecting fetal aneuploidy; had elevated AFP- see below - Discussed increased risk of fetal death: recommend antenatal testing starting at [redacted] weeks gestation; recommend delivery at 34-[redacted] weeks gestation if pregnancy remains uncomplicated - Discussed risk of twin twin transfusion syndrome is approximately 10-15%. Briefly discussed if this were to develop there are potential interventions such as amnioreduction and laser therapy. No evidence of twin twin transfusion syndrome at this time; however there is concern for possible development given discordant amniotic fluid volume. Recommend ultrasounds at least every 2 weeks to evaluate for twin twin transfusion syndrome. However given discordant amniotic fluid volumes at this time recommend follow up ultrasound on 12/21/11. If fluid volumes improve or remain stable can evaluate weekly and then may be able to space to every 2 weeks if remains normal.     2. Twin A gastroschisis: discussed this finding is likely the etiology of the elevated MSAFP. Discussed that gastroschisis is usually an isolated event that is not usually associated with aneuploidy or other anomalies. There is likely no increased risk of recurrence in future pregnancies. Again reiterated that gastroschisis is not associated with aneuploidy but given there is an anomaly and elevated MSAFP I discussed benefits/risks of amniocentesis including risks of bleeding, infection, rupture of membranes, preterm delivery, and risk of pregnancy loss of 1/400 for each fetus. Patient had normal first trimester screen and declined amniocentesis today. Discussed that there are increased risks of growth restriction and stillbirth with gastroschisis. Would recommend fetal growth every 4 weeks and antenatal testing with  twice weekly nonstress tests and weekly BPPs starting at [redacted] weeks gestation.  Would recommend delivery as above. Discussed neonatal outcomes are similar with vaginal or Cesarean delivery and therefore given the risks of C section we would recommend a vaginal delivery if possible. Discussed risks of C section include bleeding, infection, damage to surrounding organs, venous thromboembolism, prolonged recovery, and complications in future pregnancies. Discussed that the fetus may not tolerate labor and she may ultimately require a C section. Discussed neonate will go to the NICU after delivery and will be evaluated. The baby will likely be taken to the operating room within 24 hours of birth for either a primary repair of the abdominal wall or a silo. Discussed risks of gastroschisis include: requiring multiple surgeries, risk of bowel infection or necrosis and need for partial bowel removal, infant may need breathing support, will be in the hospital for some time, and has risk of infection and feeding difficulties. Patient will meet with NICU and Pediatric Surgery - referral made- to discuss specific treatments, management, and prognosis. Discussed that likely baby A would be transferred to [facility] for treatment and discussed the option of delivering at [facility] to be closer to the babies. Recommended the patient discuss a plan with her primary OB. We are happy to perform the ultrasounds here [facility] [place].  3. Follow up ultrasound on 12/21/11.   All the patient's questions were answered. I spent 45 minutes in face to face consultation with the patient in addition to time spent on the ultrasound. Results given to Dr. Marzetta Board nurse.   Thank you for the referral; please do not hesitate to call with questions.  Eulis Foster, MD

## 2011-12-21 ENCOUNTER — Other Ambulatory Visit (HOSPITAL_COMMUNITY): Payer: Self-pay | Admitting: Obstetrics and Gynecology

## 2011-12-21 ENCOUNTER — Ambulatory Visit (HOSPITAL_COMMUNITY)
Admission: RE | Admit: 2011-12-21 | Discharge: 2011-12-21 | Disposition: A | Discharge status: Home / Self Care | Source: Ambulatory Visit | Attending: Obstetrics & Gynecology | Admitting: Obstetrics & Gynecology

## 2011-12-21 VITALS — BP 114/73 | HR 85 | Wt 155.0 lb

## 2011-12-21 DIAGNOSIS — O30039 Twin pregnancy, monochorionic/diamniotic, unspecified trimester: Secondary | ICD-10-CM

## 2011-12-21 DIAGNOSIS — IMO0002 Reserved for concepts with insufficient information to code with codable children: Secondary | ICD-10-CM

## 2011-12-21 DIAGNOSIS — O358XX0 Maternal care for other (suspected) fetal abnormality and damage, not applicable or unspecified: Secondary | ICD-10-CM | POA: Insufficient documentation

## 2011-12-21 DIAGNOSIS — Z1389 Encounter for screening for other disorder: Secondary | ICD-10-CM | POA: Insufficient documentation

## 2011-12-21 DIAGNOSIS — R772 Abnormality of alphafetoprotein: Secondary | ICD-10-CM

## 2011-12-21 DIAGNOSIS — Z363 Encounter for antenatal screening for malformations: Secondary | ICD-10-CM | POA: Insufficient documentation

## 2011-12-21 DIAGNOSIS — O30009 Twin pregnancy, unspecified number of placenta and unspecified number of amniotic sacs, unspecified trimester: Secondary | ICD-10-CM | POA: Insufficient documentation

## 2011-12-21 DIAGNOSIS — Z3689 Encounter for other specified antenatal screening: Secondary | ICD-10-CM

## 2011-12-21 NOTE — Progress Notes (Signed)
Darlene Baker  was seen today for an ultrasound appointment.  See full report in [facility].  Alpha Gula, MD  MC/DA twin gestation with best dates of 17 2/7 weeks Anterior placenta noted Some fluid discordance noted - growth at last visit (10/3) was concordant.  A: cephalic, maternal left, max verticle pocket 3 cm.  Bladder visualized.  No evidence of absent / reversed diastolic flow on UA Dopplers.  Normal ductus venosus waveform. Fetal gastroschisis  B: cephalic, maternal right, max verticle pocket 6 cm.  Bladder visualized. no evidence of absent / reversed diastolic flow on UA Dopplers.  Normal ductus venosus waveform.  No evidence of acute TTTS, although continued close surveillance recommended due to fluid discordance.  Recommend follow up in 1 week - for fluid check, UA Dopplers to screen for TTTS. Growth scan in 2 weeks.  Will maker arrangements for the patient to meet with Peds surgery later this pregnancy.

## 2011-12-28 ENCOUNTER — Ambulatory Visit (HOSPITAL_COMMUNITY)
Admission: RE | Admit: 2011-12-28 | Discharge: 2011-12-28 | Disposition: A | Discharge status: Home / Self Care | Source: Ambulatory Visit | Attending: Obstetrics & Gynecology | Admitting: Obstetrics & Gynecology

## 2011-12-28 ENCOUNTER — Other Ambulatory Visit (HOSPITAL_COMMUNITY): Payer: Self-pay | Admitting: Maternal and Fetal Medicine

## 2011-12-28 VITALS — BP 123/75 | HR 97 | Wt 158.2 lb

## 2011-12-28 DIAGNOSIS — IMO0002 Reserved for concepts with insufficient information to code with codable children: Secondary | ICD-10-CM

## 2011-12-28 DIAGNOSIS — O4100X0 Oligohydramnios, unspecified trimester, not applicable or unspecified: Secondary | ICD-10-CM | POA: Insufficient documentation

## 2011-12-28 DIAGNOSIS — O99330 Smoking (tobacco) complicating pregnancy, unspecified trimester: Secondary | ICD-10-CM | POA: Insufficient documentation

## 2011-12-28 DIAGNOSIS — O30039 Twin pregnancy, monochorionic/diamniotic, unspecified trimester: Secondary | ICD-10-CM

## 2011-12-28 DIAGNOSIS — O98119 Syphilis complicating pregnancy, unspecified trimester: Secondary | ICD-10-CM | POA: Insufficient documentation

## 2011-12-28 DIAGNOSIS — O358XX0 Maternal care for other (suspected) fetal abnormality and damage, not applicable or unspecified: Secondary | ICD-10-CM | POA: Insufficient documentation

## 2011-12-28 DIAGNOSIS — O30009 Twin pregnancy, unspecified number of placenta and unspecified number of amniotic sacs, unspecified trimester: Secondary | ICD-10-CM | POA: Insufficient documentation

## 2012-01-04 ENCOUNTER — Ambulatory Visit (HOSPITAL_COMMUNITY)
Admission: RE | Admit: 2012-01-04 | Discharge: 2012-01-04 | Disposition: A | Discharge status: Home / Self Care | Source: Ambulatory Visit | Attending: Obstetrics & Gynecology | Admitting: Obstetrics & Gynecology

## 2012-01-04 ENCOUNTER — Other Ambulatory Visit (HOSPITAL_COMMUNITY): Payer: Self-pay | Admitting: Obstetrics and Gynecology

## 2012-01-04 DIAGNOSIS — IMO0002 Reserved for concepts with insufficient information to code with codable children: Secondary | ICD-10-CM

## 2012-01-04 DIAGNOSIS — O30039 Twin pregnancy, monochorionic/diamniotic, unspecified trimester: Secondary | ICD-10-CM

## 2012-01-04 DIAGNOSIS — Q897 Multiple congenital malformations, not elsewhere classified: Secondary | ICD-10-CM | POA: Insufficient documentation

## 2012-01-04 DIAGNOSIS — Z3689 Encounter for other specified antenatal screening: Secondary | ICD-10-CM

## 2012-01-04 DIAGNOSIS — R772 Abnormality of alphafetoprotein: Secondary | ICD-10-CM

## 2012-01-04 DIAGNOSIS — R799 Abnormal finding of blood chemistry, unspecified: Secondary | ICD-10-CM | POA: Insufficient documentation

## 2012-01-04 NOTE — Progress Notes (Signed)
Sherlyn Lick  was seen today for an ultrasound appointment.  See full report in [facility].  Alpha Gula, MD  Impression:  MC/DA twin gestation with best dates of 19 2/7 weeks Anterior placenta noted Some fluid discordance is again appreciated.  Fetal growth is concordant (6% discordance noted)  A: cephalic, maternal left, max verticle pocket 3.3 cm.  Bladder visualized.  UA Dopplers within normal limits without evidence of absent / reversed diastolic flow Fetal gastroschisis  B: cephalic, maternal right, max verticle pocket 6.2 cm.  Bladder visualized. No evidence of absent / reversed diastolic flow on UA Dopplers.    No evidence of acute TTTS.  Recommendations:  Limited ultrasound in 1 week for fluid check / Dopplers to screen for TTTS. If stable, will decrease frequently of evaluations to every other week.  Has appointment with Peds surgery the end of this month.

## 2012-01-11 ENCOUNTER — Ambulatory Visit (HOSPITAL_COMMUNITY)
Admission: RE | Admit: 2012-01-11 | Discharge: 2012-01-11 | Disposition: A | Discharge status: Home / Self Care | Source: Ambulatory Visit | Attending: Obstetrics & Gynecology | Admitting: Obstetrics & Gynecology

## 2012-01-11 VITALS — BP 127/80 | HR 98 | Wt 160.8 lb

## 2012-01-11 DIAGNOSIS — O30039 Twin pregnancy, monochorionic/diamniotic, unspecified trimester: Secondary | ICD-10-CM

## 2012-01-11 DIAGNOSIS — O358XX0 Maternal care for other (suspected) fetal abnormality and damage, not applicable or unspecified: Secondary | ICD-10-CM | POA: Insufficient documentation

## 2012-01-11 DIAGNOSIS — O30009 Twin pregnancy, unspecified number of placenta and unspecified number of amniotic sacs, unspecified trimester: Secondary | ICD-10-CM | POA: Insufficient documentation

## 2012-01-11 DIAGNOSIS — IMO0002 Reserved for concepts with insufficient information to code with codable children: Secondary | ICD-10-CM

## 2012-01-11 DIAGNOSIS — O4100X0 Oligohydramnios, unspecified trimester, not applicable or unspecified: Secondary | ICD-10-CM | POA: Insufficient documentation

## 2012-01-11 DIAGNOSIS — O409XX0 Polyhydramnios, unspecified trimester, not applicable or unspecified: Secondary | ICD-10-CM | POA: Insufficient documentation

## 2012-01-11 DIAGNOSIS — O99330 Smoking (tobacco) complicating pregnancy, unspecified trimester: Secondary | ICD-10-CM | POA: Insufficient documentation

## 2012-01-21 ENCOUNTER — Other Ambulatory Visit (HOSPITAL_COMMUNITY): Payer: Self-pay | Admitting: Maternal and Fetal Medicine

## 2012-01-21 DIAGNOSIS — IMO0002 Reserved for concepts with insufficient information to code with codable children: Secondary | ICD-10-CM

## 2012-01-21 DIAGNOSIS — O30039 Twin pregnancy, monochorionic/diamniotic, unspecified trimester: Secondary | ICD-10-CM

## 2012-01-25 ENCOUNTER — Ambulatory Visit (HOSPITAL_COMMUNITY)
Admission: RE | Admit: 2012-01-25 | Discharge: 2012-01-25 | Disposition: A | Discharge status: Home / Self Care | Source: Ambulatory Visit | Attending: Obstetrics & Gynecology | Admitting: Obstetrics & Gynecology

## 2012-01-25 ENCOUNTER — Other Ambulatory Visit (HOSPITAL_COMMUNITY): Payer: Self-pay | Admitting: Maternal and Fetal Medicine

## 2012-01-25 VITALS — BP 131/83 | HR 120 | Wt 163.0 lb

## 2012-01-25 DIAGNOSIS — O30039 Twin pregnancy, monochorionic/diamniotic, unspecified trimester: Secondary | ICD-10-CM

## 2012-01-25 DIAGNOSIS — IMO0002 Reserved for concepts with insufficient information to code with codable children: Secondary | ICD-10-CM

## 2012-01-25 DIAGNOSIS — O358XX0 Maternal care for other (suspected) fetal abnormality and damage, not applicable or unspecified: Secondary | ICD-10-CM | POA: Insufficient documentation

## 2012-01-25 DIAGNOSIS — O4100X0 Oligohydramnios, unspecified trimester, not applicable or unspecified: Secondary | ICD-10-CM | POA: Insufficient documentation

## 2012-01-25 DIAGNOSIS — O409XX0 Polyhydramnios, unspecified trimester, not applicable or unspecified: Secondary | ICD-10-CM | POA: Insufficient documentation

## 2012-01-25 DIAGNOSIS — O99330 Smoking (tobacco) complicating pregnancy, unspecified trimester: Secondary | ICD-10-CM | POA: Insufficient documentation

## 2012-01-25 DIAGNOSIS — O30009 Twin pregnancy, unspecified number of placenta and unspecified number of amniotic sacs, unspecified trimester: Secondary | ICD-10-CM | POA: Insufficient documentation

## 2012-02-08 ENCOUNTER — Ambulatory Visit (HOSPITAL_COMMUNITY)
Admission: RE | Admit: 2012-02-08 | Discharge: 2012-02-08 | Disposition: A | Discharge status: Home / Self Care | Source: Ambulatory Visit | Attending: Obstetrics & Gynecology | Admitting: Obstetrics & Gynecology

## 2012-02-08 DIAGNOSIS — O409XX0 Polyhydramnios, unspecified trimester, not applicable or unspecified: Secondary | ICD-10-CM | POA: Insufficient documentation

## 2012-02-08 DIAGNOSIS — O30039 Twin pregnancy, monochorionic/diamniotic, unspecified trimester: Secondary | ICD-10-CM

## 2012-02-08 DIAGNOSIS — IMO0002 Reserved for concepts with insufficient information to code with codable children: Secondary | ICD-10-CM

## 2012-02-08 DIAGNOSIS — O358XX0 Maternal care for other (suspected) fetal abnormality and damage, not applicable or unspecified: Secondary | ICD-10-CM | POA: Insufficient documentation

## 2012-02-08 DIAGNOSIS — O4100X0 Oligohydramnios, unspecified trimester, not applicable or unspecified: Secondary | ICD-10-CM | POA: Insufficient documentation

## 2012-02-08 DIAGNOSIS — O30009 Twin pregnancy, unspecified number of placenta and unspecified number of amniotic sacs, unspecified trimester: Secondary | ICD-10-CM | POA: Insufficient documentation

## 2012-02-08 DIAGNOSIS — Q7959 Other congenital malformations of abdominal wall: Secondary | ICD-10-CM | POA: Insufficient documentation

## 2012-02-15 ENCOUNTER — Other Ambulatory Visit (HOSPITAL_COMMUNITY): Payer: Self-pay | Admitting: Maternal and Fetal Medicine

## 2012-02-15 ENCOUNTER — Ambulatory Visit (HOSPITAL_COMMUNITY)
Admission: RE | Admit: 2012-02-15 | Discharge: 2012-02-15 | Disposition: A | Discharge status: Home / Self Care | Source: Ambulatory Visit | Attending: Obstetrics & Gynecology | Admitting: Obstetrics & Gynecology

## 2012-02-15 VITALS — BP 111/75 | HR 110 | Wt 168.0 lb

## 2012-02-15 DIAGNOSIS — O30039 Twin pregnancy, monochorionic/diamniotic, unspecified trimester: Secondary | ICD-10-CM

## 2012-02-15 DIAGNOSIS — O99330 Smoking (tobacco) complicating pregnancy, unspecified trimester: Secondary | ICD-10-CM | POA: Insufficient documentation

## 2012-02-15 DIAGNOSIS — O30009 Twin pregnancy, unspecified number of placenta and unspecified number of amniotic sacs, unspecified trimester: Secondary | ICD-10-CM | POA: Insufficient documentation

## 2012-02-15 DIAGNOSIS — IMO0002 Reserved for concepts with insufficient information to code with codable children: Secondary | ICD-10-CM

## 2012-02-15 DIAGNOSIS — O358XX0 Maternal care for other (suspected) fetal abnormality and damage, not applicable or unspecified: Secondary | ICD-10-CM | POA: Insufficient documentation

## 2012-02-15 DIAGNOSIS — O409XX0 Polyhydramnios, unspecified trimester, not applicable or unspecified: Secondary | ICD-10-CM | POA: Insufficient documentation

## 2012-02-15 DIAGNOSIS — O4100X0 Oligohydramnios, unspecified trimester, not applicable or unspecified: Secondary | ICD-10-CM | POA: Insufficient documentation

## 2012-02-15 NOTE — Progress Notes (Signed)
Darlene Baker  was seen today for an ultrasound appointment.  See full report in [facility].  Impression: MC/DA twin gestation with best dates of 25 2/7 weeks.  Twin A:maternal left, cephalic presentation, anterior placenta   Fetal gastroschisis  Estimated fetal weight at the 10th %tile.  Normal umbilical artery Doppler studies noted for gestational age  Fetal bladder seen; normal amniotic fluid volume (MVP 4 cm)  Twin B: maternal right, transverse presentation, anterior placenta  Estimated fetal weight at the 33rd %tile  Normal umbilical artery Doppler studies noted for gestational age  Fetal bladder seen; normal amniotic fluid volume noted (MVP 4.7 cm)  Recommendation: Will begin weekly Doppler studies / BPPs. Interval growth in 3 weeks - if marginal growth noted or elevated umbilical artery S/D ratios appreciated, would recommend course of betamethasone Plan transfer of care visit at approx 30-32 weeks - will make arrangements for NICU tour to coorelate with visit.  Alpha Gula, MD

## 2012-02-17 ENCOUNTER — Other Ambulatory Visit (HOSPITAL_COMMUNITY): Payer: Self-pay | Admitting: Maternal and Fetal Medicine

## 2012-02-17 DIAGNOSIS — O30039 Twin pregnancy, monochorionic/diamniotic, unspecified trimester: Secondary | ICD-10-CM

## 2012-02-17 DIAGNOSIS — IMO0002 Reserved for concepts with insufficient information to code with codable children: Secondary | ICD-10-CM

## 2012-02-22 ENCOUNTER — Inpatient Hospital Stay (HOSPITAL_COMMUNITY)
Admission: AD | Admit: 2012-02-22 | Discharge: 2012-02-22 | Disposition: A | Discharge status: Home / Self Care | Source: Ambulatory Visit | Attending: Obstetrics and Gynecology | Admitting: Obstetrics and Gynecology

## 2012-02-22 ENCOUNTER — Ambulatory Visit (HOSPITAL_COMMUNITY)
Admission: RE | Admit: 2012-02-22 | Discharge: 2012-02-22 | Disposition: A | Discharge status: Home / Self Care | Source: Ambulatory Visit | Attending: Obstetrics & Gynecology | Admitting: Obstetrics & Gynecology

## 2012-02-22 ENCOUNTER — Encounter (HOSPITAL_COMMUNITY): Payer: Self-pay | Admitting: Obstetrics and Gynecology

## 2012-02-22 DIAGNOSIS — O2390 Unspecified genitourinary tract infection in pregnancy, unspecified trimester: Secondary | ICD-10-CM | POA: Insufficient documentation

## 2012-02-22 DIAGNOSIS — O479 False labor, unspecified: Secondary | ICD-10-CM

## 2012-02-22 DIAGNOSIS — O4100X0 Oligohydramnios, unspecified trimester, not applicable or unspecified: Secondary | ICD-10-CM | POA: Insufficient documentation

## 2012-02-22 DIAGNOSIS — O409XX0 Polyhydramnios, unspecified trimester, not applicable or unspecified: Secondary | ICD-10-CM | POA: Insufficient documentation

## 2012-02-22 DIAGNOSIS — O358XX0 Maternal care for other (suspected) fetal abnormality and damage, not applicable or unspecified: Secondary | ICD-10-CM | POA: Insufficient documentation

## 2012-02-22 DIAGNOSIS — O99330 Smoking (tobacco) complicating pregnancy, unspecified trimester: Secondary | ICD-10-CM | POA: Insufficient documentation

## 2012-02-22 DIAGNOSIS — IMO0002 Reserved for concepts with insufficient information to code with codable children: Secondary | ICD-10-CM

## 2012-02-22 DIAGNOSIS — O30009 Twin pregnancy, unspecified number of placenta and unspecified number of amniotic sacs, unspecified trimester: Secondary | ICD-10-CM | POA: Insufficient documentation

## 2012-02-22 DIAGNOSIS — O4700 False labor before 37 completed weeks of gestation, unspecified trimester: Secondary | ICD-10-CM | POA: Insufficient documentation

## 2012-02-22 DIAGNOSIS — O30039 Twin pregnancy, monochorionic/diamniotic, unspecified trimester: Secondary | ICD-10-CM

## 2012-02-22 DIAGNOSIS — N390 Urinary tract infection, site not specified: Secondary | ICD-10-CM | POA: Insufficient documentation

## 2012-02-22 LAB — URINALYSIS, ROUTINE W REFLEX MICROSCOPIC
Glucose, UA: NEGATIVE mg/dL
Protein, ur: NEGATIVE mg/dL
Specific Gravity, Urine: >1.03 — ABNORMAL HIGH (ref 1.005–1.030)
pH: 6 (ref 5.0–8.0)

## 2012-02-22 LAB — URINE MICROSCOPIC-ADD ON

## 2012-02-22 MED ORDER — NITROFURANTOIN MONOHYD MACRO 100 MG PO CAPS: 100.0000 mg | ORAL_CAPSULE | Freq: Two times a day (BID) | ORAL | Status: DC

## 2012-02-22 NOTE — Progress Notes (Signed)
Darlene Baker was seen for ultrasound appointment today.  Please see [facility]OB[facility]N report for details.  

## 2012-02-22 NOTE — MAU Note (Signed)
Pt presents to MAU; sent from [facility] for an NST. Pt is a G1, twin gestation at [redacted]w[redacted]d. Pt want sent over here due to a BPP score of 6/8 and Dr. Arlyce Dice wanted an NST. Pt denies pain/ pressure.

## 2012-02-22 NOTE — MAU Provider Note (Signed)
Chief Complaint:  Non-stress Test   First Provider Initiated Contact with Patient 02/22/12 1812       HPI: Darlene Baker is a 26 y.o. G1P0000 at [redacted]w[redacted]d who presents to maternity admissions from [facility] following a scheduled BPP where baby A scored 6/8 and further fetal monitoring was recommended. The patient was placed on the monitor and found to be having regular contractions approximately every 3 minutes. The patient reports good fetal movement. She has not had much to eat or drink today.   Pregnancy Course:   Past Medical History: Past Medical History  Diagnosis Date  . Migraine   . UTI (lower urinary tract infection)     Past obstetric history: OB History    Grav Para Term Preterm Abortions TAB SAB Ect Mult Living   1 0 0 0 0 0 0 0 0 0      # Outc Date GA Lbr Len/2nd Wgt Sex Del Anes PTL Lv   1 CUR               Past Surgical History: History reviewed. No pertinent past surgical history.  Family History: History reviewed. No pertinent family history.  Social History: History  Substance Use Topics  . Smoking status: Current Every Day Smoker  . Smokeless tobacco: Not on file  . Alcohol Use: Yes    Allergies: No Known Allergies  Meds:  No prescriptions prior to admission    ROS: Pertinent findings in history of present illness.  Physical Exam  Blood pressure 118/68, pulse 83, temperature 98.3 F (36.8 C), temperature source Oral, resp. rate 18, last menstrual period 08/22/2011. GENERAL: Well-developed, well-nourished female in no acute distress.  HEENT: normocephalic HEART: normal rate RESP: normal effort ABDOMEN: gravid  NEURO: alert and oriented Dilation: Closed Effacement (%): Thick Cervical Position: Posterior Exam by:: Ivonne Andrew CNM   FHT:  Baseline 145 , moderate variability, accelerations present, no decelerations Contractions: q 3 mins   Labs: Results for orders placed during the hospital encounter of 02/22/12 (from the past 24  hour(s))  URINALYSIS, ROUTINE W REFLEX MICROSCOPIC     Status: Abnormal   Collection Time   02/22/12  5:25 PM      Component Value Range   Color, Urine YELLOW  YELLOW   APPearance CLEAR  CLEAR   Specific Gravity, Urine >1.030 (*) 1.005 - 1.030   pH 6.0  5.0 - 8.0   Glucose, UA NEGATIVE  NEGATIVE mg/dL   Hgb urine dipstick NEGATIVE  NEGATIVE   Bilirubin Urine NEGATIVE  NEGATIVE   Ketones, ur 15 (*) NEGATIVE mg/dL   Protein, ur NEGATIVE  NEGATIVE mg/dL   Urobilinogen, UA 0.2  0.0 - 1.0 mg/dL   Nitrite POSITIVE (*) NEGATIVE   Leukocytes, UA SMALL (*) NEGATIVE  URINE MICROSCOPIC-ADD ON     Status: Abnormal   Collection Time   02/22/12  5:25 PM      Component Value Range   Squamous Epithelial / LPF MANY (*) RARE   WBC, UA 21-50  <3 WBC/hpf   RBC / HPF 7-10  <3 RBC/hpf   Bacteria, UA MANY (*) RARE   Urine-Other MUCOUS PRESENT      Imaging:  Scheduled BPP performed in [facility] today. Baby A scored 6/8 after 30 minutes, Baby B scored 8/8 after 30 minutes.   MAU Course: The patient was placed on the monitor in MAU and found to be having contractions every 3 minutes for approximately 30 seconds each. The  strip showed numerous 10x10 accelerations and a few mild variables. The patient was given water and a snack and the contractions subsided. FFN was not performed because the patient had had intercourse within the last 24 hours.   Discussed patient with Dorathy Kinsman, CNM and Dr. Claiborne Billings. Recommended that we treat patient with macrobid PO for UTI, encourage increased PO fluid intake and keep follow-up with [facility] next week and Dr. Delray Alt office at the end of the month as previously scheduled.    Assessment: 1. Preterm contractions 2. UTI in pregnancy  Plan: Discharge home Rx for [facility] sent to pharmacy Keep appointments for [facility] and [facility] as previously scheduled Preterm labor precautions discussed       Follow-up Information     Follow up with [facility]. In 1 week.   Contact information:   [address] [id number] mc [place] [address] [phone]      Follow up with Mickel Baas, MD. (as previously scheduled)    Contact information:   [address] [place] [address] [phone]       Follow up with [facility] [facility]. (As needed if symptoms worsen)    Contact information:   [address] [id number] mc [place] [address] [phone]          Medication List     As of 02/22/2012  8:13 PM    TAKE these medications         acetaminophen 325 MG tablet   Commonly known as: TYLENOL   Take 650 mg by mouth every 6 (six) hours as needed. For headaches.      nitrofurantoin (macrocrystal-monohydrate) 100 MG capsule   Commonly known as: MACROBID   Take 1 capsule (100 mg total) by mouth 2 (two) times daily.      oxymetazoline 0.05 % nasal spray   Commonly known as: AFRIN   Place 2 sprays into the nose 2 (two) times daily.      Prenatal Vitamins (DIS) Tabs   Generic PNV, 1 po daily        Freddi Starr, PA-C 02/22/2012 8:13 PM

## 2012-02-23 ENCOUNTER — Encounter: Payer: Self-pay | Admitting: *Deleted

## 2012-02-24 LAB — URINE CULTURE

## 2012-02-28 ENCOUNTER — Other Ambulatory Visit (HOSPITAL_COMMUNITY): Payer: Self-pay | Admitting: Maternal and Fetal Medicine

## 2012-02-28 DIAGNOSIS — O30009 Twin pregnancy, unspecified number of placenta and unspecified number of amniotic sacs, unspecified trimester: Secondary | ICD-10-CM

## 2012-02-29 ENCOUNTER — Ambulatory Visit (HOSPITAL_COMMUNITY)
Admission: RE | Admit: 2012-02-29 | Discharge: 2012-02-29 | Disposition: A | Discharge status: Home / Self Care | Source: Ambulatory Visit | Attending: Obstetrics & Gynecology | Admitting: Obstetrics & Gynecology

## 2012-02-29 ENCOUNTER — Other Ambulatory Visit (HOSPITAL_COMMUNITY): Payer: Self-pay | Admitting: Maternal and Fetal Medicine

## 2012-02-29 DIAGNOSIS — O409XX0 Polyhydramnios, unspecified trimester, not applicable or unspecified: Secondary | ICD-10-CM | POA: Insufficient documentation

## 2012-02-29 DIAGNOSIS — O30009 Twin pregnancy, unspecified number of placenta and unspecified number of amniotic sacs, unspecified trimester: Secondary | ICD-10-CM

## 2012-02-29 DIAGNOSIS — O358XX0 Maternal care for other (suspected) fetal abnormality and damage, not applicable or unspecified: Secondary | ICD-10-CM | POA: Insufficient documentation

## 2012-02-29 DIAGNOSIS — O99330 Smoking (tobacco) complicating pregnancy, unspecified trimester: Secondary | ICD-10-CM | POA: Insufficient documentation

## 2012-02-29 DIAGNOSIS — O4100X0 Oligohydramnios, unspecified trimester, not applicable or unspecified: Secondary | ICD-10-CM | POA: Insufficient documentation

## 2012-02-29 NOTE — Progress Notes (Signed)
Baby A in LLQ, Baby B in RUQ.

## 2012-03-07 ENCOUNTER — Other Ambulatory Visit (HOSPITAL_COMMUNITY): Payer: Self-pay | Admitting: Obstetrics and Gynecology

## 2012-03-07 DIAGNOSIS — O30009 Twin pregnancy, unspecified number of placenta and unspecified number of amniotic sacs, unspecified trimester: Secondary | ICD-10-CM

## 2012-03-09 ENCOUNTER — Ambulatory Visit (HOSPITAL_COMMUNITY)
Admission: RE | Admit: 2012-03-09 | Discharge: 2012-03-09 | Disposition: A | Discharge status: Home / Self Care | Source: Ambulatory Visit | Attending: Obstetrics & Gynecology | Admitting: Obstetrics & Gynecology

## 2012-03-09 DIAGNOSIS — Q7959 Other congenital malformations of abdominal wall: Secondary | ICD-10-CM | POA: Insufficient documentation

## 2012-03-09 DIAGNOSIS — O358XX0 Maternal care for other (suspected) fetal abnormality and damage, not applicable or unspecified: Secondary | ICD-10-CM | POA: Insufficient documentation

## 2012-03-09 DIAGNOSIS — O409XX0 Polyhydramnios, unspecified trimester, not applicable or unspecified: Secondary | ICD-10-CM | POA: Insufficient documentation

## 2012-03-09 DIAGNOSIS — Z298 Encounter for other specified prophylactic measures: Secondary | ICD-10-CM | POA: Insufficient documentation

## 2012-03-09 DIAGNOSIS — F17200 Nicotine dependence, unspecified, uncomplicated: Secondary | ICD-10-CM | POA: Insufficient documentation

## 2012-03-09 DIAGNOSIS — O4100X0 Oligohydramnios, unspecified trimester, not applicable or unspecified: Secondary | ICD-10-CM | POA: Insufficient documentation

## 2012-03-09 DIAGNOSIS — O365990 Maternal care for other known or suspected poor fetal growth, unspecified trimester, not applicable or unspecified: Secondary | ICD-10-CM

## 2012-03-09 DIAGNOSIS — O30009 Twin pregnancy, unspecified number of placenta and unspecified number of amniotic sacs, unspecified trimester: Secondary | ICD-10-CM | POA: Insufficient documentation

## 2012-03-09 DIAGNOSIS — O360990 Maternal care for other rhesus isoimmunization, unspecified trimester, not applicable or unspecified: Secondary | ICD-10-CM | POA: Insufficient documentation

## 2012-03-09 DIAGNOSIS — Z2989 Encounter for other specified prophylactic measures: Secondary | ICD-10-CM | POA: Insufficient documentation

## 2012-03-09 DIAGNOSIS — O99340 Other mental disorders complicating pregnancy, unspecified trimester: Secondary | ICD-10-CM | POA: Insufficient documentation

## 2012-03-09 MED ORDER — BETAMETHASONE SOD PHOS & ACET 6 (3-3) MG/ML IJ SUSP
12.0000 mg | Freq: Once | INTRAMUSCULAR | Status: AC
  Administered 2012-03-09: 12 mg via INTRAMUSCULAR
  Filled 2012-03-09: qty 2

## 2012-03-09 NOTE — ED Notes (Signed)
BMZ given IM.  Pt to lobby before d/c.

## 2012-03-09 NOTE — Progress Notes (Signed)
Darlene Baker  was seen today for an ultrasound appointment.  See full report in [facility].  Impression: MC/DA twin gestation with best dates of 28 4/7 weeks.  Twin A:maternal left, cephalic presentation, anterior placenta   Fetal gastroschisis  Estimated fetal weight at the 13th %tile.  The AC measures < 3rd %tile.  Normal umbilical artery Doppler studies noted for gestational age  BPP 10/10  Amniotic fluid subjectively decreased, but max verticle pocket of 3.2 cm noted.  Twin B: maternal right, transverse presentation, anterior placenta  Estimated fetal weight at 24th %tile.  Normal umbilical artery Doppler studies noted for gestational age  BPP 8/8  Normal amniotic fluid volume  Recommendations: Continue weekly BPP / Doppler studies. Betamethasone series due to marginal fetal growth of Twin A. (First dose given in clinic today) Follow up growth scan in 3 weeks  Patient previously seen by Peds surgery at [facility].  To schedule transfer OB appointment today.    Alpha Gula, MD

## 2012-03-10 ENCOUNTER — Ambulatory Visit (HOSPITAL_COMMUNITY)
Admission: RE | Admit: 2012-03-10 | Discharge: 2012-03-10 | Disposition: A | Discharge status: Home / Self Care | Source: Ambulatory Visit | Attending: Obstetrics & Gynecology | Admitting: Obstetrics & Gynecology

## 2012-03-10 ENCOUNTER — Other Ambulatory Visit (HOSPITAL_COMMUNITY): Payer: Self-pay | Admitting: Maternal and Fetal Medicine

## 2012-03-10 DIAGNOSIS — O4100X0 Oligohydramnios, unspecified trimester, not applicable or unspecified: Secondary | ICD-10-CM | POA: Insufficient documentation

## 2012-03-10 DIAGNOSIS — O365990 Maternal care for other known or suspected poor fetal growth, unspecified trimester, not applicable or unspecified: Secondary | ICD-10-CM

## 2012-03-10 DIAGNOSIS — O30009 Twin pregnancy, unspecified number of placenta and unspecified number of amniotic sacs, unspecified trimester: Secondary | ICD-10-CM

## 2012-03-10 DIAGNOSIS — O99330 Smoking (tobacco) complicating pregnancy, unspecified trimester: Secondary | ICD-10-CM | POA: Insufficient documentation

## 2012-03-10 DIAGNOSIS — O358XX0 Maternal care for other (suspected) fetal abnormality and damage, not applicable or unspecified: Secondary | ICD-10-CM | POA: Insufficient documentation

## 2012-03-10 DIAGNOSIS — Z2989 Encounter for other specified prophylactic measures: Secondary | ICD-10-CM | POA: Insufficient documentation

## 2012-03-10 DIAGNOSIS — O35DXX0 Maternal care for other (suspected) fetal abnormality and damage, fetal gastrointestinal anomalies, not applicable or unspecified: Secondary | ICD-10-CM

## 2012-03-10 DIAGNOSIS — O409XX0 Polyhydramnios, unspecified trimester, not applicable or unspecified: Secondary | ICD-10-CM | POA: Insufficient documentation

## 2012-03-10 DIAGNOSIS — Z298 Encounter for other specified prophylactic measures: Secondary | ICD-10-CM | POA: Insufficient documentation

## 2012-03-10 DIAGNOSIS — O360990 Maternal care for other rhesus isoimmunization, unspecified trimester, not applicable or unspecified: Secondary | ICD-10-CM | POA: Insufficient documentation

## 2012-03-10 MED ORDER — BETAMETHASONE SOD PHOS & ACET 6 (3-3) MG/ML IJ SUSP
12.0000 mg | Freq: Once | INTRAMUSCULAR | Status: AC
  Administered 2012-03-10: 12 mg via INTRAMUSCULAR
  Filled 2012-03-10: qty 2

## 2012-03-14 ENCOUNTER — Ambulatory Visit (HOSPITAL_COMMUNITY)
Admission: RE | Admit: 2012-03-14 | Discharge: 2012-03-14 | Disposition: A | Discharge status: Home / Self Care | Source: Ambulatory Visit | Attending: Obstetrics & Gynecology | Admitting: Obstetrics & Gynecology

## 2012-03-14 DIAGNOSIS — O98119 Syphilis complicating pregnancy, unspecified trimester: Secondary | ICD-10-CM | POA: Insufficient documentation

## 2012-03-14 DIAGNOSIS — O358XX0 Maternal care for other (suspected) fetal abnormality and damage, not applicable or unspecified: Secondary | ICD-10-CM | POA: Insufficient documentation

## 2012-03-14 DIAGNOSIS — O365990 Maternal care for other known or suspected poor fetal growth, unspecified trimester, not applicable or unspecified: Secondary | ICD-10-CM

## 2012-03-14 DIAGNOSIS — O4100X0 Oligohydramnios, unspecified trimester, not applicable or unspecified: Secondary | ICD-10-CM | POA: Insufficient documentation

## 2012-03-14 DIAGNOSIS — O360990 Maternal care for other rhesus isoimmunization, unspecified trimester, not applicable or unspecified: Secondary | ICD-10-CM | POA: Insufficient documentation

## 2012-03-14 DIAGNOSIS — O30009 Twin pregnancy, unspecified number of placenta and unspecified number of amniotic sacs, unspecified trimester: Secondary | ICD-10-CM | POA: Insufficient documentation

## 2012-03-14 DIAGNOSIS — O99330 Smoking (tobacco) complicating pregnancy, unspecified trimester: Secondary | ICD-10-CM | POA: Insufficient documentation

## 2012-03-14 NOTE — Progress Notes (Signed)
Sherlyn Lick  was seen today for an ultrasound appointment.  See full report in [facility].  Impression: MC/DA twin gestation with best dates of 29 2/7 weeks.  Twin A:maternal left, cephalic presentation, anterior placenta   Fetal gastroschisis  Normal umbilical artery Doppler studies noted for gestational age  BPP 8/8  Normal amniotic fluid volume  Twin B: maternal right, transverse presentation, anterior placenta  Normal umbilical artery Doppler studies noted for gestational age  BPP 8/8  Normal amniotic fluid volume  Recommendations: Continue weekly BPP / Doppler studies. Betamethasone series due to marginal fetal growth of Twin A. (12/26 and 12/27) Follow up growth scan in 2 weeks  Patient previously seen by Peds surgery at [facility].     Page with questions.  Merideth Abbey, MD, MS, FACOG Assistant Professor, Maternal-Fetal Medicine

## 2012-03-22 ENCOUNTER — Other Ambulatory Visit (HOSPITAL_COMMUNITY): Payer: Self-pay | Admitting: Maternal and Fetal Medicine

## 2012-03-22 ENCOUNTER — Ambulatory Visit (HOSPITAL_COMMUNITY)
Admission: RE | Admit: 2012-03-22 | Discharge: 2012-03-22 | Disposition: A | Discharge status: Home / Self Care | Source: Ambulatory Visit | Attending: Obstetrics & Gynecology | Admitting: Obstetrics & Gynecology

## 2012-03-22 DIAGNOSIS — O365990 Maternal care for other known or suspected poor fetal growth, unspecified trimester, not applicable or unspecified: Secondary | ICD-10-CM

## 2012-03-22 DIAGNOSIS — O358XX0 Maternal care for other (suspected) fetal abnormality and damage, not applicable or unspecified: Secondary | ICD-10-CM

## 2012-03-22 DIAGNOSIS — O30009 Twin pregnancy, unspecified number of placenta and unspecified number of amniotic sacs, unspecified trimester: Secondary | ICD-10-CM | POA: Insufficient documentation

## 2012-03-22 DIAGNOSIS — O4100X0 Oligohydramnios, unspecified trimester, not applicable or unspecified: Secondary | ICD-10-CM | POA: Insufficient documentation

## 2012-03-22 DIAGNOSIS — O360990 Maternal care for other rhesus isoimmunization, unspecified trimester, not applicable or unspecified: Secondary | ICD-10-CM | POA: Insufficient documentation

## 2012-03-22 DIAGNOSIS — O409XX0 Polyhydramnios, unspecified trimester, not applicable or unspecified: Secondary | ICD-10-CM | POA: Insufficient documentation

## 2012-03-22 DIAGNOSIS — O99330 Smoking (tobacco) complicating pregnancy, unspecified trimester: Secondary | ICD-10-CM | POA: Insufficient documentation

## 2012-03-22 NOTE — Progress Notes (Signed)
Darlene Baker was seen for ultrasound appointment today.  Please see [facility]OB[facility]N report for details.  

## 2012-03-27 ENCOUNTER — Other Ambulatory Visit: Payer: Self-pay

## 2012-03-29 ENCOUNTER — Ambulatory Visit (HOSPITAL_COMMUNITY)

## 2012-03-31 ENCOUNTER — Ambulatory Visit (HOSPITAL_COMMUNITY)

## 2012-04-04 ENCOUNTER — Ambulatory Visit (HOSPITAL_COMMUNITY)
Admission: RE | Admit: 2012-04-04 | Discharge: 2012-04-04 | Disposition: A | Discharge status: Home / Self Care | Source: Ambulatory Visit | Attending: Obstetrics & Gynecology | Admitting: Obstetrics & Gynecology

## 2012-04-04 ENCOUNTER — Other Ambulatory Visit (HOSPITAL_COMMUNITY): Payer: Self-pay | Admitting: Obstetrics & Gynecology

## 2012-04-04 DIAGNOSIS — O30039 Twin pregnancy, monochorionic/diamniotic, unspecified trimester: Secondary | ICD-10-CM | POA: Insufficient documentation

## 2012-04-04 DIAGNOSIS — O30009 Twin pregnancy, unspecified number of placenta and unspecified number of amniotic sacs, unspecified trimester: Secondary | ICD-10-CM | POA: Insufficient documentation

## 2012-04-04 DIAGNOSIS — O4100X0 Oligohydramnios, unspecified trimester, not applicable or unspecified: Secondary | ICD-10-CM | POA: Insufficient documentation

## 2012-04-04 DIAGNOSIS — O99330 Smoking (tobacco) complicating pregnancy, unspecified trimester: Secondary | ICD-10-CM | POA: Insufficient documentation

## 2012-04-04 DIAGNOSIS — O409XX0 Polyhydramnios, unspecified trimester, not applicable or unspecified: Secondary | ICD-10-CM | POA: Insufficient documentation

## 2012-04-04 DIAGNOSIS — O358XX0 Maternal care for other (suspected) fetal abnormality and damage, not applicable or unspecified: Secondary | ICD-10-CM | POA: Insufficient documentation

## 2012-04-04 DIAGNOSIS — O360990 Maternal care for other rhesus isoimmunization, unspecified trimester, not applicable or unspecified: Secondary | ICD-10-CM | POA: Insufficient documentation

## 2012-04-05 ENCOUNTER — Ambulatory Visit (HOSPITAL_COMMUNITY)

## 2012-04-06 ENCOUNTER — Ambulatory Visit (HOSPITAL_COMMUNITY)

## 2012-04-06 ENCOUNTER — Other Ambulatory Visit (HOSPITAL_COMMUNITY): Payer: Self-pay | Admitting: Maternal and Fetal Medicine

## 2012-04-06 DIAGNOSIS — O30009 Twin pregnancy, unspecified number of placenta and unspecified number of amniotic sacs, unspecified trimester: Secondary | ICD-10-CM

## 2012-04-07 ENCOUNTER — Ambulatory Visit (HOSPITAL_COMMUNITY)
Admission: RE | Admit: 2012-04-07 | Discharge: 2012-04-07 | Disposition: A | Discharge status: Home / Self Care | Source: Ambulatory Visit | Attending: Obstetrics & Gynecology | Admitting: Obstetrics & Gynecology

## 2012-04-07 DIAGNOSIS — O30009 Twin pregnancy, unspecified number of placenta and unspecified number of amniotic sacs, unspecified trimester: Secondary | ICD-10-CM | POA: Insufficient documentation

## 2012-04-07 DIAGNOSIS — O99330 Smoking (tobacco) complicating pregnancy, unspecified trimester: Secondary | ICD-10-CM | POA: Insufficient documentation

## 2012-04-07 DIAGNOSIS — O4100X0 Oligohydramnios, unspecified trimester, not applicable or unspecified: Secondary | ICD-10-CM | POA: Insufficient documentation

## 2012-04-07 DIAGNOSIS — O358XX0 Maternal care for other (suspected) fetal abnormality and damage, not applicable or unspecified: Secondary | ICD-10-CM | POA: Insufficient documentation

## 2012-04-07 DIAGNOSIS — O360990 Maternal care for other rhesus isoimmunization, unspecified trimester, not applicable or unspecified: Secondary | ICD-10-CM | POA: Insufficient documentation

## 2012-04-07 DIAGNOSIS — O409XX0 Polyhydramnios, unspecified trimester, not applicable or unspecified: Secondary | ICD-10-CM | POA: Insufficient documentation

## 2012-04-12 ENCOUNTER — Other Ambulatory Visit (HOSPITAL_COMMUNITY): Payer: Self-pay | Admitting: Obstetrics & Gynecology

## 2012-04-12 DIAGNOSIS — O30009 Twin pregnancy, unspecified number of placenta and unspecified number of amniotic sacs, unspecified trimester: Secondary | ICD-10-CM

## 2012-04-13 ENCOUNTER — Ambulatory Visit (HOSPITAL_COMMUNITY)
Admission: RE | Admit: 2012-04-13 | Discharge: 2012-04-13 | Disposition: A | Discharge status: Home / Self Care | Source: Ambulatory Visit | Attending: Obstetrics & Gynecology | Admitting: Obstetrics & Gynecology

## 2012-04-13 VITALS — BP 130/81 | HR 106 | Wt 178.2 lb

## 2012-04-13 DIAGNOSIS — IMO0002 Reserved for concepts with insufficient information to code with codable children: Secondary | ICD-10-CM | POA: Insufficient documentation

## 2012-04-13 DIAGNOSIS — O99330 Smoking (tobacco) complicating pregnancy, unspecified trimester: Secondary | ICD-10-CM | POA: Insufficient documentation

## 2012-04-13 DIAGNOSIS — O30009 Twin pregnancy, unspecified number of placenta and unspecified number of amniotic sacs, unspecified trimester: Secondary | ICD-10-CM | POA: Insufficient documentation

## 2012-04-13 DIAGNOSIS — O358XX0 Maternal care for other (suspected) fetal abnormality and damage, not applicable or unspecified: Secondary | ICD-10-CM | POA: Insufficient documentation

## 2012-04-13 DIAGNOSIS — O409XX0 Polyhydramnios, unspecified trimester, not applicable or unspecified: Secondary | ICD-10-CM | POA: Insufficient documentation

## 2012-04-13 DIAGNOSIS — O4100X0 Oligohydramnios, unspecified trimester, not applicable or unspecified: Secondary | ICD-10-CM | POA: Insufficient documentation

## 2012-04-13 NOTE — Progress Notes (Signed)
Darlene Baker  was seen today for an ultrasound appointment.  See full report in [facility].  Impression: Monochorionic/diamniotic twin pregnancy at 33+4 weeks Normal amniotic fluid volume x 2 BPP 8/8 x 2 UA dopplers were normal for this GA x 2  Recommendations: Continue twice weekly testing Has follow up at [facility] in 2 weeks for interval growth scan.  Alpha Gula, MD

## 2012-04-14 ENCOUNTER — Other Ambulatory Visit (HOSPITAL_COMMUNITY): Payer: Self-pay | Admitting: Obstetrics & Gynecology

## 2012-04-14 DIAGNOSIS — O30009 Twin pregnancy, unspecified number of placenta and unspecified number of amniotic sacs, unspecified trimester: Secondary | ICD-10-CM

## 2012-04-18 ENCOUNTER — Ambulatory Visit (HOSPITAL_COMMUNITY)
Admission: RE | Admit: 2012-04-18 | Discharge: 2012-04-18 | Disposition: A | Discharge status: Home / Self Care | Source: Ambulatory Visit | Attending: Obstetrics & Gynecology | Admitting: Obstetrics & Gynecology

## 2012-04-18 VITALS — BP 130/81 | HR 93 | Wt 178.0 lb

## 2012-04-18 DIAGNOSIS — F17200 Nicotine dependence, unspecified, uncomplicated: Secondary | ICD-10-CM

## 2012-04-18 DIAGNOSIS — O358XX0 Maternal care for other (suspected) fetal abnormality and damage, not applicable or unspecified: Secondary | ICD-10-CM | POA: Insufficient documentation

## 2012-04-18 DIAGNOSIS — O365990 Maternal care for other known or suspected poor fetal growth, unspecified trimester, not applicable or unspecified: Secondary | ICD-10-CM

## 2012-04-18 DIAGNOSIS — O30009 Twin pregnancy, unspecified number of placenta and unspecified number of amniotic sacs, unspecified trimester: Secondary | ICD-10-CM

## 2012-04-18 DIAGNOSIS — O360990 Maternal care for other rhesus isoimmunization, unspecified trimester, not applicable or unspecified: Secondary | ICD-10-CM | POA: Insufficient documentation

## 2012-04-18 DIAGNOSIS — O99330 Smoking (tobacco) complicating pregnancy, unspecified trimester: Secondary | ICD-10-CM | POA: Insufficient documentation

## 2012-04-18 DIAGNOSIS — O4100X0 Oligohydramnios, unspecified trimester, not applicable or unspecified: Secondary | ICD-10-CM | POA: Insufficient documentation

## 2012-04-18 DIAGNOSIS — O409XX0 Polyhydramnios, unspecified trimester, not applicable or unspecified: Secondary | ICD-10-CM | POA: Insufficient documentation

## 2012-04-18 NOTE — Progress Notes (Signed)
Darlene Baker  was seen today for an ultrasound appointment.  See full report in [facility]N.  Impression: Monochorionic/diamniotic twin pregnancy at 34+2 weeks Twin A with fetal gastroschisis Normal amniotic fluid volume x 2 BPP 8/8 x 2 UA dopplers were normal for this GA x 2  Recommendation: Patient has follow up growth scan at [facility] on 10 February. Continue twice weekly testing / Doppler studies  Alpha Gula, MD

## 2012-04-19 ENCOUNTER — Other Ambulatory Visit (HOSPITAL_COMMUNITY): Payer: Self-pay | Admitting: Obstetrics & Gynecology

## 2012-04-19 ENCOUNTER — Other Ambulatory Visit (HOSPITAL_COMMUNITY): Payer: Self-pay | Admitting: Maternal and Fetal Medicine

## 2012-04-19 DIAGNOSIS — O358XX0 Maternal care for other (suspected) fetal abnormality and damage, not applicable or unspecified: Secondary | ICD-10-CM

## 2012-04-19 DIAGNOSIS — O30009 Twin pregnancy, unspecified number of placenta and unspecified number of amniotic sacs, unspecified trimester: Secondary | ICD-10-CM

## 2012-04-19 DIAGNOSIS — O35DXX0 Maternal care for other (suspected) fetal abnormality and damage, fetal gastrointestinal anomalies, not applicable or unspecified: Secondary | ICD-10-CM

## 2012-04-19 DIAGNOSIS — O365990 Maternal care for other known or suspected poor fetal growth, unspecified trimester, not applicable or unspecified: Secondary | ICD-10-CM

## 2012-04-19 DIAGNOSIS — F17200 Nicotine dependence, unspecified, uncomplicated: Secondary | ICD-10-CM

## 2012-04-21 ENCOUNTER — Ambulatory Visit (HOSPITAL_COMMUNITY)
Admission: RE | Admit: 2012-04-21 | Discharge: 2012-04-21 | Disposition: A | Discharge status: Home / Self Care | Source: Ambulatory Visit | Attending: Obstetrics & Gynecology | Admitting: Obstetrics & Gynecology

## 2012-04-21 DIAGNOSIS — O409XX0 Polyhydramnios, unspecified trimester, not applicable or unspecified: Secondary | ICD-10-CM | POA: Insufficient documentation

## 2012-04-21 DIAGNOSIS — O30009 Twin pregnancy, unspecified number of placenta and unspecified number of amniotic sacs, unspecified trimester: Secondary | ICD-10-CM

## 2012-04-21 DIAGNOSIS — O4100X0 Oligohydramnios, unspecified trimester, not applicable or unspecified: Secondary | ICD-10-CM | POA: Insufficient documentation

## 2012-04-21 DIAGNOSIS — O358XX0 Maternal care for other (suspected) fetal abnormality and damage, not applicable or unspecified: Secondary | ICD-10-CM | POA: Insufficient documentation

## 2012-04-21 DIAGNOSIS — O99330 Smoking (tobacco) complicating pregnancy, unspecified trimester: Secondary | ICD-10-CM | POA: Insufficient documentation

## 2012-04-21 NOTE — Progress Notes (Signed)
Darlene Baker  was seen today for an ultrasound appointment.  See full report in [facility]/GYN.  Impression: Monochorionic/diamniotic twin pregnancy at 34+2 weeks Twin A with fetal gastroschisis Twin A - amniotic fluid volume subjectively decreased, but MVP of 3 cm noted.  Twin B - normal amniotic fluid volume BPP 8/8 x 2 UA dopplers were normal for this GA x 2  Recommendations: Patient has follow up growth scan at [facility] on 10 February. Continue twice weekly testing / Doppler studies  Alpha Gula, MD

## 2012-04-26 ENCOUNTER — Ambulatory Visit (HOSPITAL_COMMUNITY)
Admission: RE | Admit: 2012-04-26 | Discharge: 2012-04-26 | Disposition: A | Discharge status: Home / Self Care | Source: Ambulatory Visit | Attending: Maternal and Fetal Medicine | Admitting: Maternal and Fetal Medicine

## 2012-04-26 DIAGNOSIS — O365990 Maternal care for other known or suspected poor fetal growth, unspecified trimester, not applicable or unspecified: Secondary | ICD-10-CM

## 2012-04-26 DIAGNOSIS — O358XX0 Maternal care for other (suspected) fetal abnormality and damage, not applicable or unspecified: Secondary | ICD-10-CM | POA: Insufficient documentation

## 2012-04-26 DIAGNOSIS — O409XX0 Polyhydramnios, unspecified trimester, not applicable or unspecified: Secondary | ICD-10-CM | POA: Insufficient documentation

## 2012-04-26 DIAGNOSIS — O99330 Smoking (tobacco) complicating pregnancy, unspecified trimester: Secondary | ICD-10-CM | POA: Insufficient documentation

## 2012-04-26 DIAGNOSIS — O4100X0 Oligohydramnios, unspecified trimester, not applicable or unspecified: Secondary | ICD-10-CM | POA: Insufficient documentation

## 2012-04-26 DIAGNOSIS — F17200 Nicotine dependence, unspecified, uncomplicated: Secondary | ICD-10-CM

## 2012-04-26 DIAGNOSIS — O30009 Twin pregnancy, unspecified number of placenta and unspecified number of amniotic sacs, unspecified trimester: Secondary | ICD-10-CM | POA: Insufficient documentation

## 2012-04-26 NOTE — Progress Notes (Signed)
Ms. Cara was seen for ultrasound appointment today.  Please see [facility]-OBGYN report for details.

## 2012-04-27 ENCOUNTER — Inpatient Hospital Stay (HOSPITAL_COMMUNITY): Admission: RE | Admit: 2012-04-27 | Source: Ambulatory Visit

## 2012-05-15 ENCOUNTER — Encounter (HOSPITAL_COMMUNITY): Payer: Self-pay | Admitting: *Deleted

## 2012-05-15 ENCOUNTER — Telehealth (HOSPITAL_COMMUNITY): Payer: Self-pay | Admitting: *Deleted

## 2012-05-15 NOTE — Telephone Encounter (Signed)
**Note De-identified  Obfuscation** Resolve episode 

## 2012-05-15 NOTE — Telephone Encounter (Signed)
Preadmission screen  

## 2012-05-22 ENCOUNTER — Telehealth: Payer: Self-pay

## 2012-05-22 NOTE — Telephone Encounter (Signed)
Pt said she was advised by insurance co to get coverage for Suboxone (for Opioid dependence)pt would need PCP to prescribe or refer pt to someone who would prescribe Suboxone. Pt said she wants to get off pain medications.Please advise. [facility].

## 2012-05-22 NOTE — Telephone Encounter (Signed)
Called the patient back and gave her [facility] information in [place].

## 2012-05-22 NOTE — Telephone Encounter (Signed)
Can you give to patient

## 2012-06-05 ENCOUNTER — Ambulatory Visit (INDEPENDENT_AMBULATORY_CARE_PROVIDER_SITE_OTHER): Admitting: Family Medicine

## 2012-06-05 ENCOUNTER — Encounter: Payer: Self-pay | Admitting: Family Medicine

## 2012-06-05 VITALS — BP 110/78 | HR 64 | Temp 98.6°F | Wt 157.8 lb

## 2012-06-05 DIAGNOSIS — J309 Allergic rhinitis, unspecified: Secondary | ICD-10-CM

## 2012-06-05 DIAGNOSIS — F329 Major depressive disorder, single episode, unspecified: Secondary | ICD-10-CM

## 2012-06-05 DIAGNOSIS — O99345 Other mental disorders complicating the puerperium: Secondary | ICD-10-CM

## 2012-06-05 DIAGNOSIS — F53 Postpartum depression: Secondary | ICD-10-CM

## 2012-06-05 MED ORDER — FLUOXETINE HCL 20 MG PO TABS: 20.0000 mg | ORAL_TABLET | Freq: Every day | ORAL | Status: DC

## 2012-06-05 MED ORDER — FLUTICASONE PROPIONATE 50 MCG/ACT NA SUSP: 2.0000 | Freq: Every day | NASAL | Status: DC

## 2012-06-05 NOTE — Progress Notes (Signed)
Nature conservation officer at [facility] [address] [place] [address] Phone: [phone] Fax: [phone]  Date:  06/05/2012   Name:  Darlene Baker   DOB:  1985/06/03   MRN:  [id number] Gender: female Age: 27 y.o.  Primary Physician:  Hannah Beat, MD  Evaluating MD: Hannah Beat, MD   Chief Complaint: Nasal Congestion   History of Present Illness:  Darlene Baker is a 27 y.o. pleasant patient who presents with the following:  Having some post-partum depression.  05/01/2012. Baby with gastroschesis. ICU, finally  Boyfriend lives with her.  Crying a lot.  Has been getting some anxious / shortness of breath, no sleeping.  Tough time with boyfriend.  Allergic rhinitis: Tried Afrin, saline, some fluid behind ears, eyes itchy. Nasal congestion and runny nose. No pain. No fever.    Patient Active Problem List  Diagnosis  . TOBACCO USE  . SLEEP DISORDER  . HEADACHE, CHRONIC  . REDUCTION MAMMOPLASTY, HX OF    Past Medical History  Diagnosis Date  . Migraine   . UTI (lower urinary tract infection)     No past surgical history on file.  History   Social History  . Marital Status: Single    Spouse Name: N/A    Number of Children: N/A  . Years of Education: N/A   Occupational History  . nursing student    Social History Main Topics  . Smoking status: Current Every Day Smoker  . Smokeless tobacco: Not on file  . Alcohol Use: Yes  . Drug Use: No  . Sexually Active: Not on file   Other Topics Concern  . Not on file   Social History Narrative   Regular exercise-no    No family history on file.  No Known Allergies  Medication list has been reviewed and updated.  Outpatient Prescriptions Prior to Visit  Medication Sig Dispense Refill  . acetaminophen (TYLENOL) 325 MG tablet Take 650 mg by mouth every 6 (six) hours as needed. For headaches.      Marland Kitchen oxymetazoline (AFRIN) 0.05 % nasal spray Place 2 sprays into the nose 2 (two) times daily.       . nitrofurantoin, macrocrystal-monohydrate, (MACROBID) 100 MG capsule Take 1 capsule (100 mg total) by mouth 2 (two) times daily.  14 capsule  0  . Prenatal Vitamins (DIS) TABS Generic PNV, 1 po daily  90 tablet  3   No facility-administered medications prior to visit.    Review of Systems:  6 weeks post-partum, no fever, facial pain. + depression, anxiety.  Physical Examination: BP 110/78  Pulse 64  Temp(Src) 98.6 F (37 C) (Oral)  Wt 157 lb 12 oz (71.555 kg)  BMI 27.95 kg/m2  SpO2 99%  Breastfeeding? No  Ideal Body Weight:     Gen: WDWN, NAD; A & O x3, cooperative. Pleasant.Globally Non-toxic HEENT: Normocephalic and atraumatic. Throat clear, w/o exudate, R TM +fluid, L TM + fluid - good landmarks. rhinnorhea.  MMM Frontal sinuses: NT Max sinuses: NT NECK: Anterior cervical  LAD is absent CV: RRR, No M/G/R, cap refill <2 sec PULM: Breathing comfortably in no respiratory distress. no wheezing, crackles, rhonchi EXT: No c/c/e PSYCH: FLAT AFFECT MSK: Nml gait    Assessment and Plan:  Allergic rhinitis  Post partum depression  >25 minutes spent in face to face time with patient, >50% spent in counselling or coordination of care: sig post-partum depression, minimal support structures, boyfriend committed x 4 times in 1 year, mother  with history of suicide. + dep, + anxiety, poor sleep, up all night. Twins, 1 in hospital still at 62 weeks old at [place] - born with gastroschesis. No si/hi. Some guilt.  Orders Today:  No orders of the defined types were placed in this encounter.    Updated Medication List: (Includes new medications, updates to list, dose adjustments) Meds ordered this encounter  Medications  . fluticasone (FLONASE) 50 MCG/ACT nasal spray    Sig: Place 2 sprays into the nose daily.    Dispense:  16 g    Refill:  12  . FLUoxetine (PROZAC) 20 MG tablet    Sig: Take 1 tablet (20 mg total) by mouth daily.    Dispense:  30 tablet    Refill:  3     Medications Discontinued: Medications Discontinued During This Encounter  Medication Reason  . Prenatal Vitamins (DIS) TABS Patient has not taken in last 30 days  . nitrofurantoin, macrocrystal-monohydrate, (MACROBID) 100 MG capsule Patient has not taken in last 30 days      Signed, Jens Siems T. Roc Streett, MD 06/05/2012 10:12 AM

## 2012-06-05 NOTE — Patient Instructions (Signed)
F/u 4 weeks

## 2012-10-31 ENCOUNTER — Encounter (HOSPITAL_COMMUNITY): Payer: Self-pay

## 2012-10-31 ENCOUNTER — Emergency Department (HOSPITAL_COMMUNITY)
Admission: EM | Admit: 2012-10-31 | Discharge: 2012-10-31 | Disposition: A | Discharge status: Home / Self Care | Payer: Medicaid Other | Attending: Emergency Medicine | Admitting: Emergency Medicine

## 2012-10-31 DIAGNOSIS — R0681 Apnea, not elsewhere classified: Secondary | ICD-10-CM | POA: Insufficient documentation

## 2012-10-31 DIAGNOSIS — F1120 Opioid dependence, uncomplicated: Secondary | ICD-10-CM | POA: Insufficient documentation

## 2012-10-31 DIAGNOSIS — Y9389 Activity, other specified: Secondary | ICD-10-CM | POA: Insufficient documentation

## 2012-10-31 DIAGNOSIS — T401X1A Poisoning by heroin, accidental (unintentional), initial encounter: Secondary | ICD-10-CM | POA: Insufficient documentation

## 2012-10-31 DIAGNOSIS — T401X4A Poisoning by heroin, undetermined, initial encounter: Secondary | ICD-10-CM | POA: Insufficient documentation

## 2012-10-31 DIAGNOSIS — Y929 Unspecified place or not applicable: Secondary | ICD-10-CM | POA: Insufficient documentation

## 2012-10-31 DIAGNOSIS — Z87440 Personal history of urinary (tract) infections: Secondary | ICD-10-CM | POA: Insufficient documentation

## 2012-10-31 DIAGNOSIS — F17200 Nicotine dependence, unspecified, uncomplicated: Secondary | ICD-10-CM | POA: Insufficient documentation

## 2012-10-31 DIAGNOSIS — Z8679 Personal history of other diseases of the circulatory system: Secondary | ICD-10-CM | POA: Insufficient documentation

## 2012-10-31 DIAGNOSIS — R000 Tachycardia, unspecified: Secondary | ICD-10-CM | POA: Insufficient documentation

## 2012-10-31 NOTE — ED Notes (Signed)
Pt. Given water. Alert and oriented x4. Relaxing in bed at this time.

## 2012-10-31 NOTE — ED Provider Notes (Signed)
CSN: [id number]     Arrival date & time 10/31/12  2012 History     First MD Initiated Contact with Patient 10/31/12 2013     Chief Complaint  Patient presents with  . Drug Overdose   (Consider location/radiation/quality/duration/timing/severity/associated sxs/prior Treatment) HPI Comments: 27 yo female with heroin abuse hx presents after being found apneic by her bf in the bathroom 20 min PTA.  Brief cpr done and then narcan given to bring patient back to her normal self.  She admitted to using and feels horrible for it.  She has not had other drugs.  She denies SI or HI.  Close friends with patient. Sxs resolved.    Patient is a 27 y.o. female presenting with Overdose. The history is provided by the patient.  Drug Overdose This is a recurrent problem. Pertinent negatives include no chest pain, no abdominal pain, no headaches and no shortness of breath.    Past Medical History  Diagnosis Date  . Migraine   . UTI (lower urinary tract infection)    Past Surgical History  Procedure Laterality Date  . Breast surgery      reduction in 2010   No family history on file. History  Substance Use Topics  . Smoking status: Current Every Day Smoker -- 1.00 packs/day    Types: Cigarettes  . Smokeless tobacco: Not on file  . Alcohol Use: Yes   OB History   Grav Para Term Preterm Abortions TAB SAB Ect Mult Living   1 0 0 0 0 0 0 0 0 0      Review of Systems  Constitutional: Negative for fever and chills.  HENT: Negative for neck pain and neck stiffness.   Eyes: Negative for visual disturbance.  Respiratory: Negative for shortness of breath.   Cardiovascular: Negative for chest pain.  Gastrointestinal: Negative for vomiting and abdominal pain.  Genitourinary: Negative for dysuria and flank pain.  Musculoskeletal: Negative for back pain.  Skin: Negative for rash.  Neurological: Negative for light-headedness and headaches.    Allergies  Review of patient's allergies indicates no  known allergies.  Home Medications   Current Outpatient Rx  Name  Route  Sig  Dispense  Refill  . oxymetazoline (AFRIN) 0.05 % nasal spray   Nasal   Place 2 sprays into the nose 2 (two) times daily.          BP 119/73  Pulse 95  Temp(Src) 98 F (36.7 C) (Oral)  Resp 17  SpO2 100%  LMP 09/30/2012 Physical Exam  Nursing note and vitals reviewed. Constitutional: She is oriented to person, place, and time. She appears well-developed and well-nourished.  HENT:  Head: Normocephalic and atraumatic.  Eyes: Conjunctivae are normal. Right eye exhibits no discharge. Left eye exhibits no discharge.  Neck: Normal range of motion. Neck supple. No tracheal deviation present.  Cardiovascular: Regular rhythm.  Tachycardia present.   Pulmonary/Chest: Effort normal and breath sounds normal.  Abdominal: Soft. She exhibits no distension. There is no tenderness. There is no guarding.  Musculoskeletal: She exhibits no edema.  Neurological: She is alert and oriented to person, place, and time. No cranial nerve deficit.  Skin: Skin is warm. Rash (mild swelling left lower lip) noted.  Psychiatric: She has a normal mood and affect.    ED Course   Procedures (including critical care time)  Labs Reviewed - No data to display No results found. No diagnosis found.  MDM  Pt at baseline.  Observed for 3 hrs post narcan.  Pt has close friends Doctor, general practice- not involved  In drugs) to watch/ help. Discussed risks of drugs and outpt treatment.  DC  Enid Skeens, MD 10/31/12 2248

## 2012-10-31 NOTE — ED Notes (Signed)
PER EMS, pt found unresponsive in bathroom due to overdose on heroin. Upon fire dept arrival patient was pulseless and apneic. CPR started at 1920. Pt. Given .75 of Narcan at 1922. Pt. Became responsive and CPR ended at 1926, no shocks were given. Pt. Alert and oriented upon arrival. Vitals WDL.

## 2012-11-17 ENCOUNTER — Telehealth: Payer: Self-pay | Admitting: Family Medicine

## 2012-11-17 MED ORDER — AMOXICILLIN 500 MG PO CAPS: 1000.0000 mg | ORAL_CAPSULE | Freq: Two times a day (BID) | ORAL | Status: DC

## 2012-11-17 NOTE — Telephone Encounter (Signed)
Discussed. Sounds like afrin rebound, allergies, now sinusitis. i am comfortable with sending in some amox in this case. Also discussed heroin and her recent almost OD.   Hannah Beat, MD 11/17/2012, 3:42 PM

## 2012-11-17 NOTE — Telephone Encounter (Signed)
Patient Information:  Caller Name: Merian  Phone: [phone]  Patient: Darlene Baker, Darlene Baker  Gender: Female  DOB: [birth year]  Age: 27 Years  PCP: Hannah Beat (Family Practice)  Pregnant: No  Office Follow Up:  Does the office need to follow up with this patient?: No  Instructions For The Office: N/A  RN Note:  Has been using flonase as directed, but has been very congested x 3 weeks.  States has sinus pressure and headache.  Afebrile.  States has some swelling on bilateral cheeks, as well as an earache on occasion.  Per sinus pain protocol, disposition See in Office Now; no appts available in office 11/17/12 within dispositioned time frame.  Patient offered appt in AM at [facility]; declines and states wants to be seen today.  Info to office for staff/provider review/appt workin/callback.  May reach patient at [phone].  krs/can  Symptoms  Reason For Call & Symptoms: ? sinus infection; seen 2 months ago and using flonase  Reviewed Health History In EMR: Yes  Reviewed Medications In EMR: Yes  Reviewed Allergies In EMR: Yes  Reviewed Surgeries / Procedures: Yes  Date of Onset of Symptoms: 10/27/2012 OB / GYN:  LMP: 11/03/2012  Guideline(s) Used:  Sinus Pain and Congestion  Disposition Per Guideline:   Go to Office Now  Reason For Disposition Reached:   Redness or swelling on the cheek, forehead, or around the eye  Advice Given:  N/A  Patient Will Follow Care Advice:  YES

## 2013-03-24 ENCOUNTER — Emergency Department (HOSPITAL_COMMUNITY)
Admission: EM | Admit: 2013-03-24 | Discharge: 2013-03-24 | Discharge status: Against Medical Advice | Payer: Medicaid Other | Attending: Emergency Medicine | Admitting: Emergency Medicine

## 2013-03-24 ENCOUNTER — Encounter (HOSPITAL_COMMUNITY): Payer: Self-pay | Admitting: Emergency Medicine

## 2013-03-24 DIAGNOSIS — Z79899 Other long term (current) drug therapy: Secondary | ICD-10-CM | POA: Insufficient documentation

## 2013-03-24 DIAGNOSIS — G43909 Migraine, unspecified, not intractable, without status migrainosus: Secondary | ICD-10-CM | POA: Insufficient documentation

## 2013-03-24 DIAGNOSIS — F17200 Nicotine dependence, unspecified, uncomplicated: Secondary | ICD-10-CM | POA: Insufficient documentation

## 2013-03-24 DIAGNOSIS — F1110 Opioid abuse, uncomplicated: Secondary | ICD-10-CM | POA: Insufficient documentation

## 2013-03-24 DIAGNOSIS — Z87440 Personal history of urinary (tract) infections: Secondary | ICD-10-CM | POA: Insufficient documentation

## 2013-03-24 MED ORDER — SODIUM CHLORIDE 0.9 % IV BOLUS (SEPSIS)
1000.0000 mL | Freq: Once | INTRAVENOUS | Status: AC
  Administered 2013-03-24: 1000 mL via INTRAVENOUS

## 2013-03-24 NOTE — ED Provider Notes (Signed)
Patient received a signout from PA gamin at shift change. 28 year old female presenting to the ED after injecting Opana at 3 AM.  Patient has a history of the same states she has never injected this amount before. Denies SI or HI.  Pt has been awake and alert but drowsy.  Plan:  Continue to monitor.  Reassess at 0900.  8:06 AM Called into pts room because she wants to leave.  I have advised her that i was instructed to monitor her until 0900.  Pt states "last time they shot me up with narcan and i left after 3 hours."  I have advised her that this is not our treatment plan today.  Pt still upset stating "well i'm signing myself out because this is fucking ridiculous".  Pt advised of the risks of leaving prior to full observation period including death, she acknowledged understanding and still wishes to leave. Pt signed out AMA.  Garlon HatchetLisa M Marae Cottrell, PA-C 03/24/13 [phone]

## 2013-03-24 NOTE — ED Notes (Signed)
Bed: WA09 Expected date:  Expected time:  Means of arrival:  Comments: Heroin use

## 2013-03-24 NOTE — ED Notes (Addendum)
Pt told PA that she wanted to leave AMA. Pt advised that she should be monitored for at least 1hr. Pt refused. Pt made aware that if there is any change in her status, she should come back for eval. Pt agreed and stated that she understood. Pt A&O and in NAD upon d/c

## 2013-03-24 NOTE — ED Provider Notes (Signed)
Medical screening examination/treatment/procedure(s) were performed by non-physician practitioner and as supervising physician I was immediately available for consultation/collaboration.    Olivia Mackielga M Jori Frerichs, MD 03/24/13 [phone]

## 2013-03-24 NOTE — Discharge Instructions (Signed)
Refrain from using IV drugs-- this is hazardous to your health.

## 2013-03-24 NOTE — ED Notes (Signed)
Per EMS pt.from home who was reported by boyfriend  of heroin use and became hard to arouse, pt. Denied of use but claimed of taking only 2mg  of clonopin. Pt. Was fully awake upon EMS arrival, denies any distress, walked to the bathroom at home before ambulating to the ambulance.coherent and cooperative per EMS.

## 2013-03-24 NOTE — ED Provider Notes (Signed)
Medical screening examination/treatment/procedure(s) were performed by non-physician practitioner and as supervising physician I was immediately available for consultation/collaboration.  EKG Interpretation   None         Junius ArgyleForrest S Nadene Witherspoon, MD 03/24/13 1900

## 2013-03-24 NOTE — ED Provider Notes (Signed)
CSN: [id number]     Arrival date & time 03/24/13  0413 History   First MD Initiated Contact with Patient 03/24/13 [phone]     Chief Complaint  Patient presents with  . heroin use    HPI  History provided by the patient. Patient is a 28 year old female with history of IV heroin drug use who presents to the emergency department with concerns for possible overdose. Patient states that her boyfriend was concerned that she may overdose because she was not responding well and called EMS. Patient admits to injecting Opana. She reports using a 10 mg tab. Pt used around 3 am. She states she has done this before but usually breaks it in half. She does have prior history of heroin overdose requiring Narcan in the emergency department. She has not had any other history of complicated overdose. She denies any SI or HI. States she was using to avoid withdrawal and to get high. She denies any other symptoms at this time. Denies any chest pain or shortness of breath.     Past Medical History  Diagnosis Date  . Migraine   . UTI (lower urinary tract infection)    Past Surgical History  Procedure Laterality Date  . Breast surgery      reduction in 2010   History reviewed. No pertinent family history. History  Substance Use Topics  . Smoking status: Current Every Day Smoker -- 1.00 packs/day    Types: Cigarettes  . Smokeless tobacco: Not on file  . Alcohol Use: Yes   OB History   Grav Para Term Preterm Abortions TAB SAB Ect Mult Living   1 0 0 0 0 0 0 0 0 0      Review of Systems  All other systems reviewed and are negative.    Allergies  Review of patient's allergies indicates no known allergies.  Home Medications   Current Outpatient Rx  Name  Route  Sig  Dispense  Refill  . clonazePAM (KLONOPIN) 2 MG tablet   Oral   Take 2 mg by mouth once.          BP 129/89  Pulse 107  Temp(Src) 97.8 F (36.6 C) (Oral)  Resp 18  SpO2 100% Physical Exam  Nursing note and vitals  reviewed. Constitutional: She is oriented to person, place, and time. She appears well-developed and well-nourished. No distress.  HENT:  Head: Normocephalic and atraumatic.  Eyes: Conjunctivae and EOM are normal.  Pupils pinpoint and equal.  Neck: Normal range of motion. Neck supple.  No meningeal signs  Cardiovascular: Normal rate and regular rhythm.   Pulmonary/Chest: Effort normal. No respiratory distress. She has no wheezes. She has no rales.  Abdominal: Soft. There is no tenderness.  Musculoskeletal: Normal range of motion.  Neurological: She is alert and oriented to person, place, and time.  Skin: Skin is warm and dry. No rash noted.  Psychiatric: She has a normal mood and affect. Her behavior is normal.    ED Course  Procedures   DIAGNOSTIC STUDIES: Oxygen Saturation is 100% on room air.Marland Kitchen    COORDINATION OF CARE:  Nursing notes reviewed. Vital signs reviewed. Initial pt interview and examination performed.   4:45 AM patient seen and evaluated. Patient awake and alert. She does appear drowsy. Pupils pinpoint. Does admit to injecting the Opana. Patient in no acute distress. Normal respirations.  Pt discussed in signout with Sharilyn Sites PA-C.  She will continue to observe pt for next 3 hrs (6 hrs post injection).  MDM   1. Narcotic abuse        Angus Sellereter S Joella Saefong, [place] JerseyPA-C 03/24/13 [phone]

## 2013-09-16 IMAGING — US US UA DOPPLER RE-EVAL
1 series · 13 of 28 positions shown · non-contrast
Comparison: none

[Series 1: us ua doppler re-eval · 38 acquisitions, 13 frames shown]
[im 2/38]
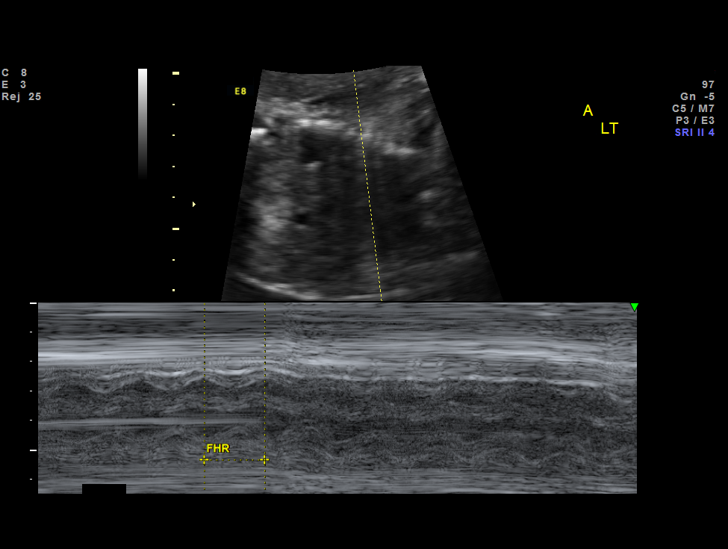
[im 5/38]
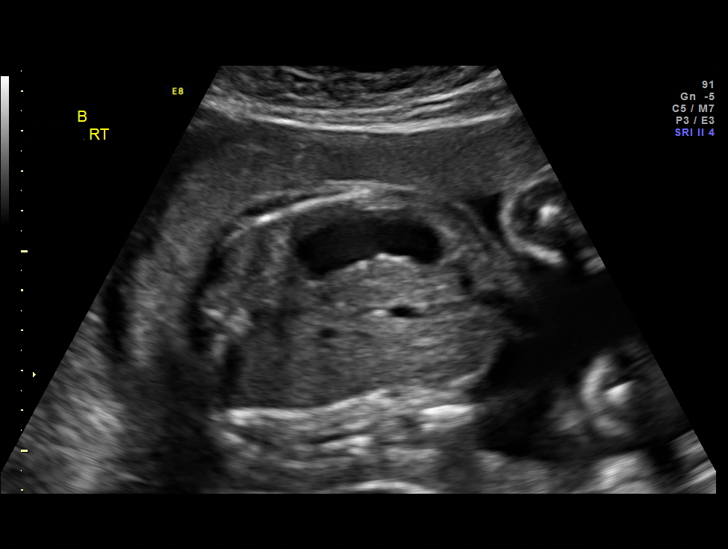
[im 7/38]
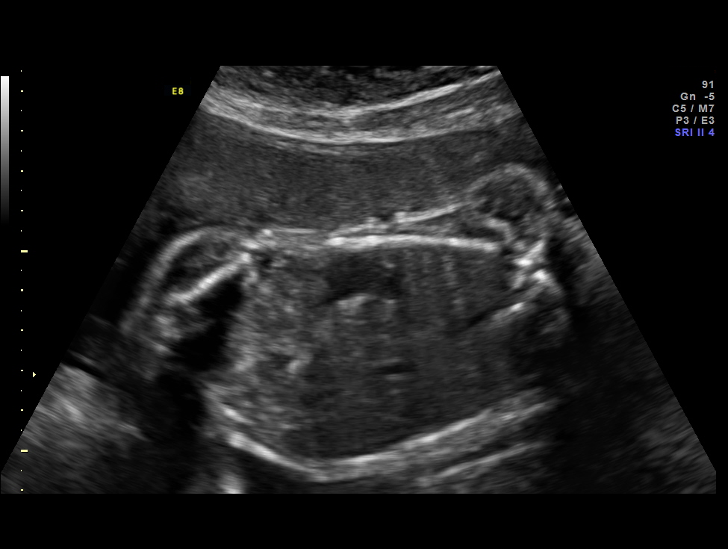
[im 10/38]
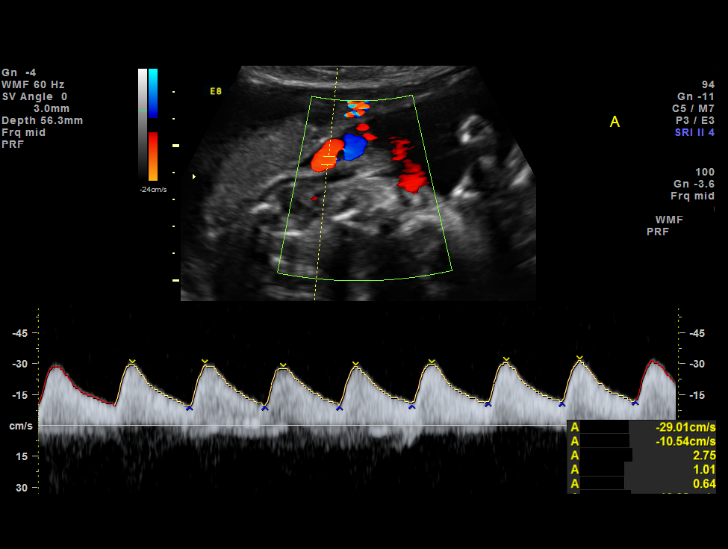
[im 13/38]
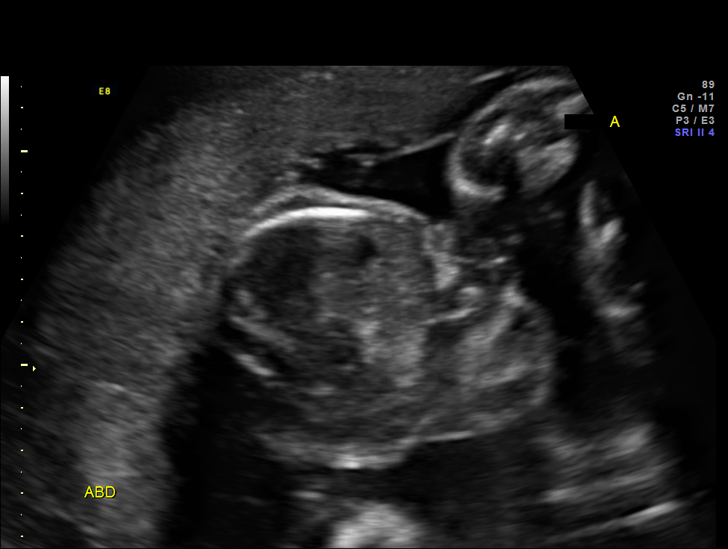
[im 16/38]
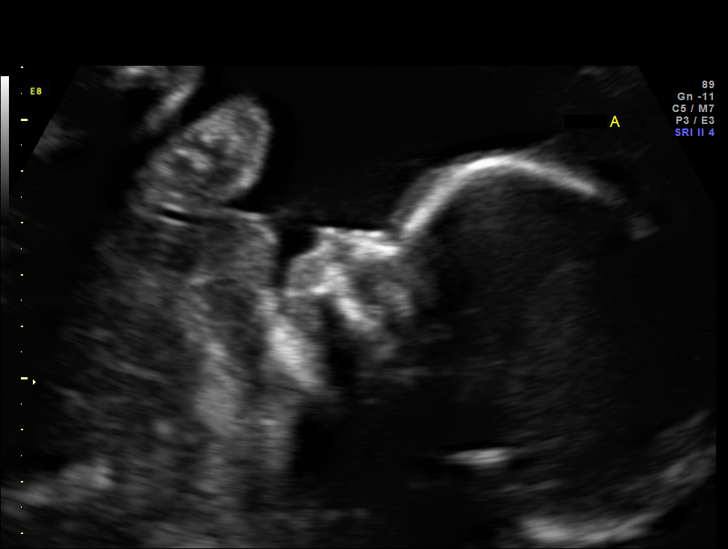
[im 20/38]
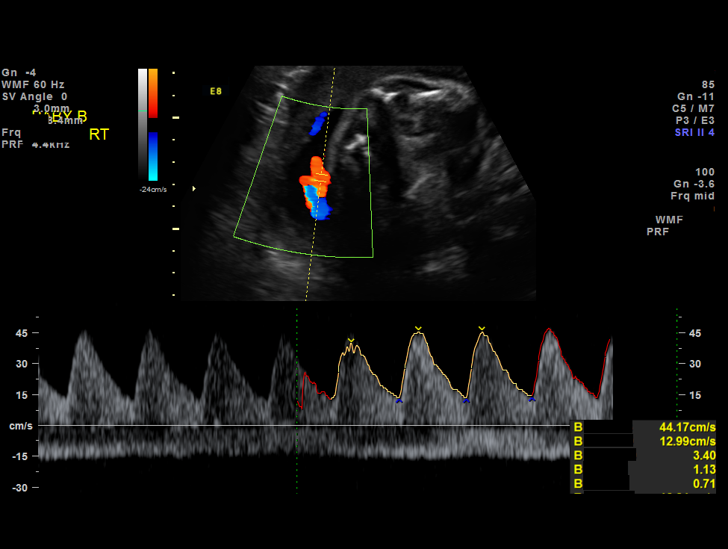
[im 22/38]
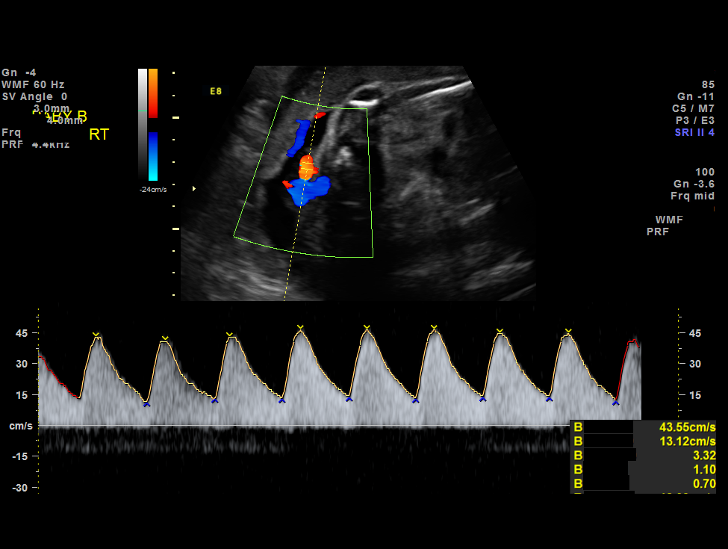
[im 25/38]
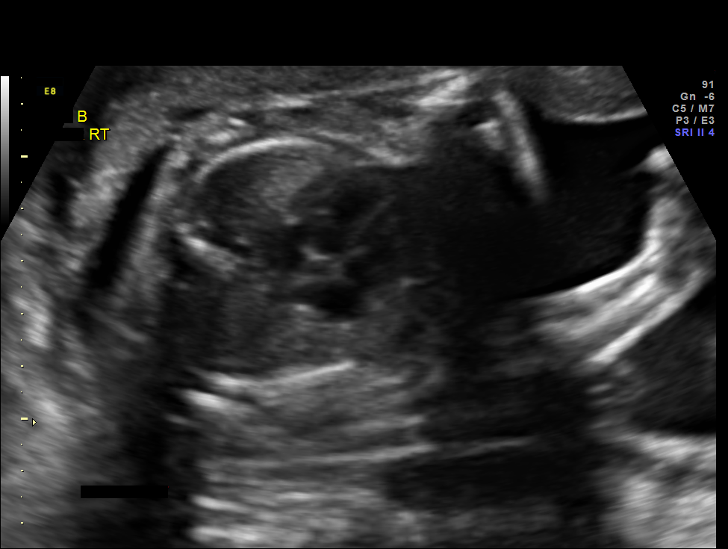
[im 28/38]
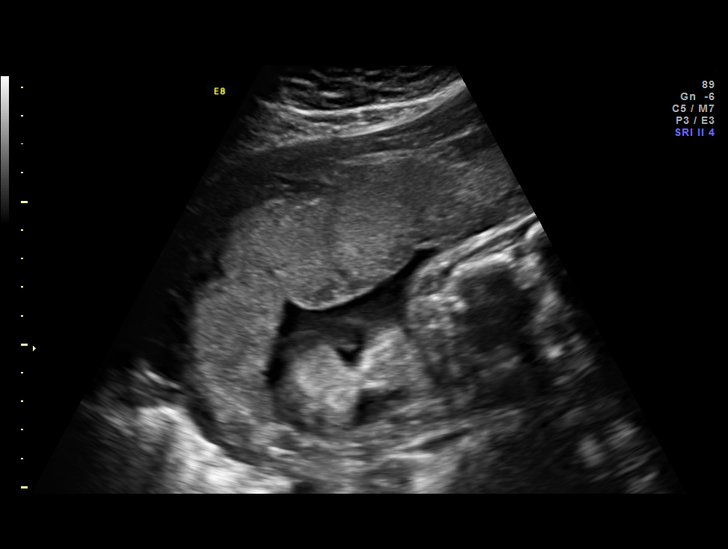
[im 31/38]
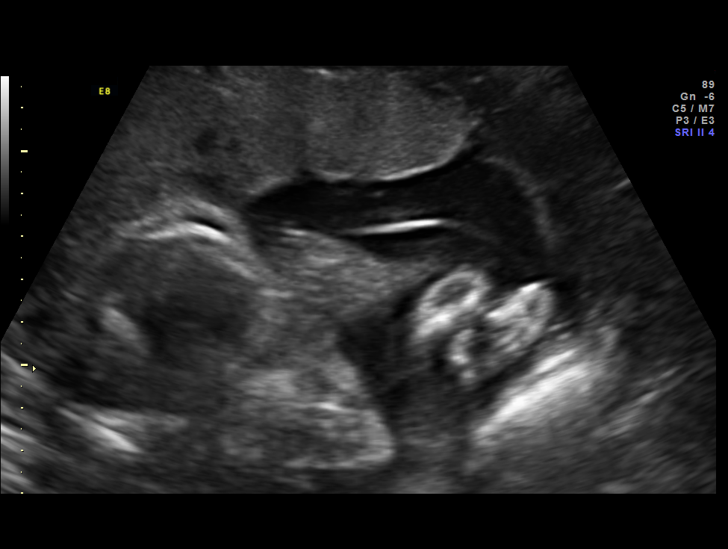
[im 33/38]
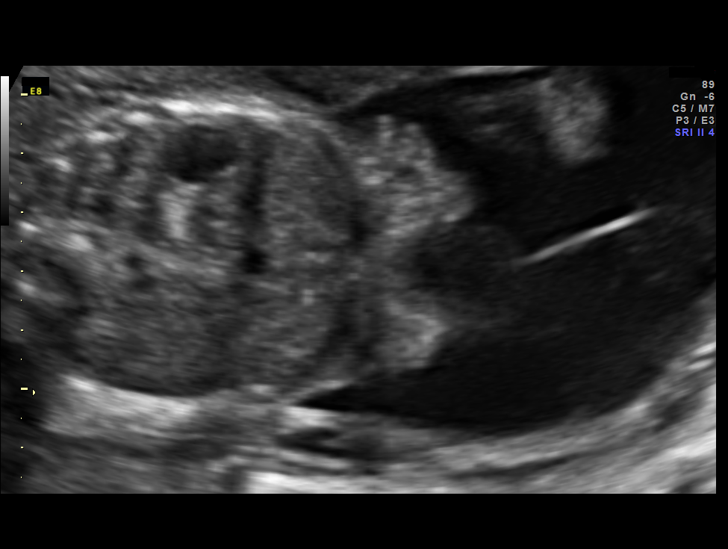
[im 36/38]
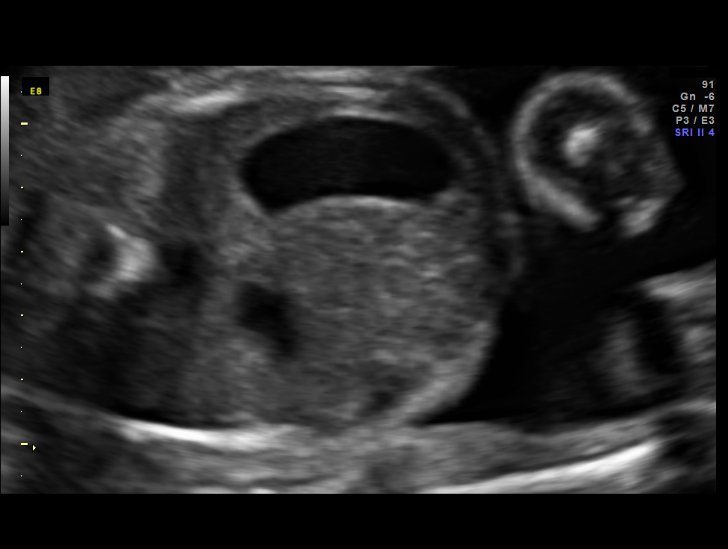

[13 of 28 positions shown; findings below may reference images not displayed]

OBSTETRICS REPORT
                      (Signed Final 02/29/2012 [DATE])

Service(s) Provided

 US UA DOPPLER RE-EVAL                                 76828.1
 US UA DOPPLER ADDL GEST RE EVAL                       76828.2
Indications

 Twin gestation, [place]
 Gastroschisis - Twin A
 Decreased amniotic fluid volume - Twin A
 Increased amniotic fluid volume - Twin B
 Cigarette smoker
Fetal Evaluation (Fetus A)

 Num Of Fetuses:    2
 Fetal Heart Rate:  167                          bpm
 Cardiac Activity:  Observed
 Fetal Lie:         Left Fetus
 Presentation:      Cephalic
 Placenta:          Anterior, above cervical os
 P. Cord            Previously Visualized
 Insertion:

 Amniotic Fluid
 AFI FV:      Subjectively within normal limits
                                             Larg Pckt:     4.0  cm
Biophysical Evaluation (Fetus A)

 Amniotic F.V:   Within normal limits       F. Tone:        Observed
 F. Movement:    Observed                   Score:          [DATE]
 F. Breathing:   Not Observed
Gestational Age (Fetus A)

 LMP:           27w 2d        Date:  08/22/11                 EDD:   05/28/12
 Best:          27w 2d     Det. By:  LMP  (08/22/11)          EDD:   05/28/12
Doppler - Fetal Vessels (Fetus A)
 Umbilical Artery
 S/D:   2.73           31  %tile       RI:
 PI:    0.99                           PSV:       29.49   cm/s

Fetal Evaluation (Fetus B)

 Num Of Fetuses:    2
 Fetal Heart Rate:  161                          bpm
 Cardiac Activity:  Observed
 Fetal Lie:         Right Fetus/superior
 Presentation:      Transverse, head to
                    maternal right
 Placenta:          Anterior, above cervical os
 P. Cord            Previously Visualized
 Insertion:

 Amniotic Fluid
 AFI FV:      Subjectively within normal limits
                                             Larg Pckt:     4.5  cm
Biophysical Evaluation (Fetus B)

 Amniotic F.V:   Within normal limits       F. Tone:        Observed
 F. Movement:    Observed                   Score:          [DATE]
 F. Breathing:   Observed
Gestational Age (Fetus B)

 LMP:           27w 2d        Date:  08/22/11                 EDD:   05/28/12
 Best:          27w 2d     Det. By:  LMP  (08/22/11)          EDD:   05/28/12
Doppler - Fetal Vessels (Fetus B)

 Umbilical Artery
 S/D:   3.3            58  %tile       RI:
 PI:    1.11                           PSV:       48.37   cm/s

Cervix Uterus Adnexa

 Cervix:       Normal appearance by transabdominal scan. Appears
               closed, without funnelling.
Impression

 Monochorionic/diamniotic twin pregnancy at 27+2 weeks
 Twin A: gastroschisis
 Normal amniotic fluid volume x 2
 Twin A: BPP [DATE]; NST appropriate for GA, good BTBV
 without decelerations
 Twin B: BPP [DATE]
 UA dopplers were normal for this GA x 2
Recommendations

 Growth US next week
 questions or concerns.

## 2013-12-28 ENCOUNTER — Other Ambulatory Visit: Payer: Self-pay

## 2014-01-14 ENCOUNTER — Encounter (HOSPITAL_COMMUNITY): Payer: Self-pay | Admitting: Emergency Medicine

## 2014-10-15 ENCOUNTER — Encounter: Payer: Self-pay | Admitting: Family Medicine

## 2014-10-15 ENCOUNTER — Ambulatory Visit: Payer: Medicaid Other | Admitting: Family Medicine

## 2014-10-15 ENCOUNTER — Ambulatory Visit (INDEPENDENT_AMBULATORY_CARE_PROVIDER_SITE_OTHER): Admitting: Family Medicine

## 2014-10-15 VITALS — BP 100/68 | HR 57 | Temp 98.5°F | Ht 63.0 in | Wt 166.5 lb

## 2014-10-15 DIAGNOSIS — R5383 Other fatigue: Secondary | ICD-10-CM

## 2014-10-15 DIAGNOSIS — Z79899 Other long term (current) drug therapy: Secondary | ICD-10-CM

## 2014-10-15 DIAGNOSIS — F1120 Opioid dependence, uncomplicated: Secondary | ICD-10-CM | POA: Diagnosis not present

## 2014-10-15 DIAGNOSIS — G479 Sleep disorder, unspecified: Secondary | ICD-10-CM | POA: Diagnosis not present

## 2014-10-15 DIAGNOSIS — Z5181 Encounter for therapeutic drug level monitoring: Secondary | ICD-10-CM

## 2014-10-15 HISTORY — DX: Encounter for therapeutic drug level monitoring: Z51.81

## 2014-10-15 HISTORY — DX: Opioid dependence, uncomplicated: F11.20

## 2014-10-15 NOTE — Progress Notes (Signed)
Pre visit review using our clinic review tool, if applicable. No additional management support is needed unless otherwise documented below in the visit note. 

## 2014-10-15 NOTE — Progress Notes (Signed)
Dr. Karleen Hampshire T. Jermain Curt, MD, CAQ Sports Medicine Primary Care and Sports Medicine [address] [place], [address] Phone: [phone] Fax: [phone]  10/15/2014  Patient: Darlene Baker, MRN: [id number], DOB: [birth year], 29 y.o.  Primary Physician:  Hannah Beat, MD  Chief Complaint: Fatigue and Leg Swelling  Subjective:   Darlene Baker is a 30 y.o. very pleasant female patient who presents with the following:  H/o heroin addiction in recovery, now on Suboxone through the [place] in [place].   Extremely tired  Not staying awake at her desk.  57 1/28 year old twins.  Other family  / cousin helps with kids  LMP 10/12/2014  7 hours a night.  Kids sleep pretty well.   She feels tired almost all the time during the day. She falls asleep when she is not active, and she falls asleep when she is sitting at work and when she is sitting in a chair on the couch at home. She basically does not feel well at all well rested ever.  Wt Readings from Last 3 Encounters:  10/15/14 166 lb 8 oz (75.524 kg)  06/05/12 157 lb 12 oz (71.555 kg)  04/26/12 181 lb 8 oz (82.328 kg)    Ongoing about 2 years.  On Suboxone for 1 1/2 years. Goes to the [facility] for Suboxone.   No depression at all.  Smoking 1/2 PPD. No alcohol.   Past Medical History, Surgical History, Social History, Family History, Problem List, Medications, and Allergies have been reviewed and updated if relevant.  Patient Active Problem List   Diagnosis Date Noted  . 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 Chronic Suboxone maintenance 10/15/2014  . 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 TOBACCO USE 05/09/2009  . HEADACHE, CHRONIC 05/09/2009  . REDUCTION MAMMOPLASTY, HX OF 05/09/2009    Past Medical History  Diagnosis Date  . Migraine   . UTI (lower urinary tract infection)   . 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 Chronic Suboxone maintenance 10/15/2014    Past  Surgical History  Procedure Laterality Date  . Breast surgery      reduction in 2010    History   Social History  . Marital Status: Single    Spouse Name: N/A  . Number of Children: N/A  . Years of Education: N/A   Occupational History  . nursing student    Social History Main Topics  . Smoking status: Current Every Day Smoker -- 1.00 packs/day    Types: Cigarettes  . Smokeless tobacco: Never Used  . Alcohol Use: 0.0 oz/week    0 Standard drinks or equivalent per week  . Drug Use: No     Comment: heroin  . Sexual Activity: Not on file   Other Topics Concern  . Not on file   Social History Narrative   Regular exercise-no    No family history on file.  No Known Allergies  Medication list reviewed and updated in full in [place] [facility].   Medication list reviewed and updated in full in [facility].   GEN: No acute illnesses, no fevers, chills. GI: No n/v/d, eating normally Pulm: No SOB Interactive and getting along well at home.  Otherwise, ROS is as per the HPI.  Objective:   BP 100/68 mmHg  Pulse 57  Temp(Src) 98.5 F (36.9 C) (Oral)  Ht 5\' 3"  (1.6 m)  Wt 166 lb 8 oz (75.524 kg)  BMI  29.50 kg/m2  LMP 10/02/2014  GEN: WDWN, NAD, Non-toxic, A & O x 3 HEENT: Atraumatic, Normocephalic. Neck supple. No masses, No LAD. Ears and Nose: No external deformity. CV: RRR, No M/G/R. No JVD. No thrill. No extra heart sounds. PULM: CTA B, no wheezes, crackles, rhonchi. No retractions. No resp. distress. No accessory muscle use. EXTR: No c/c/e NEURO Normal gait.  PSYCH: Normally interactive. Conversant. Not depressed or anxious appearing.  Calm demeanor.   Laboratory and Imaging Data:  Assessment and Plan:   Other fatigue  Heroin addiction, in recovery  Chronic Suboxone maintenance  Disturbance in sleep behavior  She has labs that were just done at the [place] in [place]. She is going to bring me a copy. I will review this and see if there is anything  additional that we need to work.  Depending on what her laboratories show, I think that the next step could be to get a sleep study.  I do not know how much this could be Suboxone itself. The patient tells me that the head of the Suboxone maintenance program was doubtful that this was the cause of her fatigue.  Signed,  Elpidio Galea. Osa Campoli, MD   Patient's Medications  New Prescriptions   No medications on file  Previous Medications   BUPRENORPHINE-NALOXONE (SUBOXONE) 8-2 MG SUBL SL TABLET    Place 1.5 tablets under the tongue daily.  Modified Medications   No medications on file  Discontinued Medications   CLONAZEPAM (KLONOPIN) 2 MG TABLET    Take 2 mg by mouth once.

## 2014-11-19 ENCOUNTER — Ambulatory Visit (INDEPENDENT_AMBULATORY_CARE_PROVIDER_SITE_OTHER): Admitting: Family Medicine

## 2014-11-19 ENCOUNTER — Ambulatory Visit (INDEPENDENT_AMBULATORY_CARE_PROVIDER_SITE_OTHER)
Admission: RE | Admit: 2014-11-19 | Discharge: 2014-11-19 | Disposition: A | Discharge status: Home / Self Care | Source: Ambulatory Visit | Attending: Family Medicine | Admitting: Family Medicine

## 2014-11-19 ENCOUNTER — Encounter: Payer: Self-pay | Admitting: Family Medicine

## 2014-11-19 VITALS — BP 118/72 | HR 77 | Temp 98.4°F | Ht 63.0 in | Wt 166.0 lb

## 2014-11-19 DIAGNOSIS — M25561 Pain in right knee: Secondary | ICD-10-CM

## 2014-11-19 DIAGNOSIS — R1013 Epigastric pain: Secondary | ICD-10-CM | POA: Diagnosis not present

## 2014-11-19 MED ORDER — DICLOFENAC SODIUM 75 MG PO TBEC: 75.0000 mg | DELAYED_RELEASE_TABLET | Freq: Two times a day (BID) | ORAL | Status: DC

## 2014-11-19 NOTE — Progress Notes (Signed)
Pre visit review using our clinic review tool, if applicable. No additional management support is needed unless otherwise documented below in the visit note. 

## 2014-11-19 NOTE — Patient Instructions (Addendum)
No lifiting > 10 lbs , no twisting with knee, no squatting.  Use crutches  as needed. Wear brace on knee.  Ice on knee 3 times daily. Start diclofenac 75 mg twice daily for pain and inflammation. If any abdominal pain with the diclofenac.Marland Kitchen Stop and call.  Start prilosec 2 tabs of 20 mg daily for 2-4 weeks for abdominal symptoms.. Call Dr. Salena Saner if not better in that time. IF improved continue prilosec for 4-6 weeks total.  Stop [facility]. Quit smoking.  Stop at X-ray on way out, we will call with results.  Follow up with Dr Patsy Lager in 2 weeks.

## 2014-11-19 NOTE — Progress Notes (Addendum)
Subjective:    Patient ID: Darlene Baker, female    DOB: [birth year], 29 y.o.   MRN: [id number]  HPI  29 year old female pt of Dr. Cyndie Chime presents with new onset pain in   Right knee in last 24 hours. 4 weeks ago when squatting she felt stuck, heard pop then felt fine. Repeated 2-3 more times in the following weeks, then was fine until yesterday.Marland Kitchen She was squatting.. felt she could not straighten knee. Heard popping again.  No redness, some swelling.  Pain over anterior knee both sides. Cannot bend or straighten all the way.  Throbbing pain at rest, cannot weight bear at all because feels like it will give out. Using crutches.  No known injury, falls etc.  No t using any med to treat.  She runs every day 2 miles, usual for her.  HX: no knee problems or joint issues.    Also having, pain in upper abdomen 2 weeks. Burning off and on. Feels in AM when she wakes up.  No blood in stool. No D/C. Fells some better with eating, then starts again.  No heartburn. No change in diet.   She feels oc nauseous, one episode of emesis. Regular menses. She drinks 3-4 mountain dews a day.  She has not tried anything for her symptoms.     Review of Systems  Constitutional: Negative for fever and fatigue.  HENT: Negative for ear pain.   Eyes: Negative for pain.  Respiratory: Negative for chest tightness and shortness of breath.   Cardiovascular: Negative for chest pain, palpitations and leg swelling.  Gastrointestinal: Positive for nausea and abdominal pain.  Genitourinary: Negative for dysuria.       Objective:   Physical Exam  Constitutional: Vital signs are normal. She appears well-developed and well-nourished. She is cooperative.  Non-toxic appearance. She does not appear ill. No distress.  HENT:  Head: Normocephalic.  Right Ear: Hearing, tympanic membrane, external ear and ear canal normal. Tympanic membrane is not erythematous, not retracted and not bulging.  Left Ear:  Hearing, tympanic membrane, external ear and ear canal normal. Tympanic membrane is not erythematous, not retracted and not bulging.  Nose: No mucosal edema or rhinorrhea. Right sinus exhibits no maxillary sinus tenderness and no frontal sinus tenderness. Left sinus exhibits no maxillary sinus tenderness and no frontal sinus tenderness.  Mouth/Throat: Uvula is midline, oropharynx is clear and moist and mucous membranes are normal.  Eyes: Conjunctivae, EOM and lids are normal. Pupils are equal, round, and reactive to light. Lids are everted and swept, no foreign bodies found.  Neck: Trachea normal and normal range of motion. Neck supple. Carotid bruit is not present. No thyroid mass and no thyromegaly present.  Cardiovascular: Normal rate, regular rhythm, S1 normal, S2 normal, normal heart sounds, intact distal pulses and normal pulses.  Exam reveals no gallop and no friction rub.   No murmur heard. Pulmonary/Chest: Effort normal and breath sounds normal. No tachypnea. No respiratory distress. She has no decreased breath sounds. She has no wheezes. She has no rhonchi. She has no rales.  Abdominal: Soft. Normal appearance and bowel sounds are normal. There is no hepatosplenomegaly. There is tenderness in the epigastric area. There is no rigidity, no rebound, no guarding and no CVA tenderness.  Musculoskeletal:       Right knee: She exhibits decreased range of motion, swelling, effusion and abnormal meniscus. She exhibits no bony tenderness and no MCL laxity.  No tenderness to palpation, pain with flexion  and extension and weight bearing. Painful Mcmurrary's, but no popping.  neg ant and post drawer.  Neurological: She is alert.  Skin: Skin is warm, dry and intact. No rash noted.  Psychiatric: Her speech is normal and behavior is normal. Judgment and thought content normal. Her mood appears not anxious. Cognition and memory are normal. She does not exhibit a depressed mood.          Assessment &  Plan:

## 2014-11-19 NOTE — Assessment & Plan Note (Signed)
Gastritis vs PUD vs gallbladder issue.  Stop excessive soda intake. Start PPI.  If NSAID worsening symptoms..  Stop and call us.

## 2014-11-19 NOTE — Addendum Note (Signed)
Addended byKerby Nora E on: 11/19/2014 10:10 AM   Modules accepted: SmartSet

## 2014-11-19 NOTE — Assessment & Plan Note (Signed)
Concern for meniscal tear. Eval with X-ray. 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 Follow up in 2 weeks with PCP.

## 2014-12-02 ENCOUNTER — Ambulatory Visit (INDEPENDENT_AMBULATORY_CARE_PROVIDER_SITE_OTHER): Admitting: Family Medicine

## 2014-12-02 ENCOUNTER — Encounter: Payer: Self-pay | Admitting: Family Medicine

## 2014-12-02 VITALS — BP 100/70 | HR 77 | Temp 98.5°F | Ht 63.0 in | Wt 167.5 lb

## 2014-12-02 DIAGNOSIS — M2391 Unspecified internal derangement of right knee: Secondary | ICD-10-CM

## 2014-12-02 DIAGNOSIS — M25561 Pain in right knee: Secondary | ICD-10-CM | POA: Diagnosis not present

## 2014-12-02 NOTE — Progress Notes (Signed)
Pre visit review using our clinic review tool, if applicable. No additional management support is needed unless otherwise documented below in the visit note. 

## 2014-12-02 NOTE — Progress Notes (Signed)
Dr. Karleen Hampshire T. Copland, MD, CAQ Sports Medicine Primary Care and Sports Medicine [address] [place], [address] Phone: [phone] Fax: [phone]  12/02/2014  Patient: Darlene Baker, MRN: [id number], DOB: Dec 02, 1985, 29 y.o.  Primary Physician:  Hannah Beat, MD  Chief Complaint: Follow-up  Subjective:   Darlene Baker is a 29 y.o. very pleasant female patient who presents with the following:  Few weeks ago on labor day, about a month ago, squatting down and then could not get up and knee popped and felt ok. Same thing happened 2 or 3 times, then started to gradually swell up.   Feels a lot better, still hurts some and some like it will give out.  Ice pack at night Hinged compression brace.   Past Medical History, Surgical History, Social History, Family History, Problem List, Medications, and Allergies have been reviewed and updated if relevant.  Patient Active Problem List   Diagnosis Date Noted  . Right anterior knee pain 11/19/2014  . Epigastric abdominal pain 11/19/2014  . 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 Chronic Suboxone maintenance 10/15/2014  . 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 TOBACCO USE 05/09/2009  . HEADACHE, CHRONIC 05/09/2009  . REDUCTION MAMMOPLASTY, HX OF 05/09/2009    Past Medical History  Diagnosis Date  . Migraine   . 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 Chronic Suboxone maintenance 10/15/2014    Past Surgical History  Procedure Laterality Date  . Breast surgery      reduction in 2010    Social History   Social History  . Marital Status: Single    Spouse Name: N/A  . Number of Children: N/A  . Years of Education: N/A   Occupational History  . nursing student    Social History Main Topics  . Smoking status: Current Every Day Smoker -- 1.00 packs/day    Types: Cigarettes  . Smokeless tobacco: Never Used  . Alcohol Use: 0.0 oz/week    0 Standard drinks or  equivalent per week  . Drug Use: No     Comment: heroin  . Sexual Activity: Not on file   Other Topics Concern  . Not on file   Social History Narrative   Regular exercise-no    No family history on file.  No Known Allergies  Medication list reviewed and updated in full in [place] [facility].  GEN: No fevers, chills. Nontoxic. Primarily MSK c/o today. MSK: Detailed in the HPI GI: tolerating PO intake without difficulty Neuro: No numbness, parasthesias, or tingling associated. Otherwise the pertinent positives of the ROS are noted above.   Objective:   BP 100/70 mmHg  Pulse 77  Temp(Src) 98.5 F (36.9 C) (Oral)  Ht  (1.6 m)  Wt 167 lb 8 oz (75.978 kg)  BMI 29.68 kg/m2  LMP 11/30/2014   GEN: WDWN, NAD, Non-toxic, Alert & Oriented x 3 HEENT: Atraumatic, Normocephalic.  Ears and Nose: No external deformity. EXTR: No clubbing/cyanosis/edema NEURO: Normal gait.  PSYCH: Normally interactive. Conversant. Not depressed or anxious appearing.  Calm demeanor.   Knee:  R Gait: Normal heel toe pattern ROM: 0-118 Effusion: moderate Echymosis or edema: none Patellar tendon NT Painful PLICA: neg Patellar grind: negative Medial and lateral patellar facet loading: negative medial and lateral joint lines: medial joint line tenderness Mcmurray's pos for pain Flexion-pinch pos Varus and valgus stress: stable Lachman: neg Ant and Post drawer: neg Hip abduction, IR, ER: WNL Hip  flexion str: 5/5 Hip abd: 5/5 Quad: 5/5 VMO atrophy:No Hamstring concentric and eccentric: 5/5   Radiology: Dg Knee Complete 4 Views Right  11/19/2014   CLINICAL DATA:  Right knee pain after squatting yesterday, felt a pop  EXAM: RIGHT KNEE - COMPLETE 4+ VIEW  COMPARISON:  None.  FINDINGS: Five views of the right knee submitted. No acute fracture or subluxation. Small joint effusion.  IMPRESSION: No fracture or subluxation.  Small joint effusion.   Electronically Signed   By: Natasha Mead M.D.   On:  11/19/2014 10:38    Assessment and Plan:   Internal derangement of right knee  Right anterior knee pain   More likely meniscal pathology. The patient prefers to give this more time, which is appropriate with f/u recheck in a few weeks. She has improved over the last couple of weeks.  Cont NSAIDS and ice  Follow-up: Return in about 2 weeks (around 12/16/2014).  Signed,  Elpidio Galea. Copland, MD   Patient's Medications  New Prescriptions   No medications on file  Previous Medications   BUPRENORPHINE-NALOXONE (SUBOXONE) 8-2 MG SUBL SL TABLET    Place 1.5 tablets under the tongue daily.   DICLOFENAC (VOLTAREN) 75 MG EC TABLET    Take 1 tablet (75 mg total) by mouth 2 (two) times daily.  Modified Medications   No medications on file  Discontinued Medications   No medications on file

## 2014-12-16 ENCOUNTER — Ambulatory Visit (INDEPENDENT_AMBULATORY_CARE_PROVIDER_SITE_OTHER): Admitting: Family Medicine

## 2014-12-16 ENCOUNTER — Encounter: Payer: Self-pay | Admitting: Family Medicine

## 2014-12-16 VITALS — BP 90/66 | HR 59 | Temp 97.9°F | Ht 63.0 in | Wt 167.8 lb

## 2014-12-16 DIAGNOSIS — G479 Sleep disorder, unspecified: Secondary | ICD-10-CM

## 2014-12-16 DIAGNOSIS — M2391 Unspecified internal derangement of right knee: Secondary | ICD-10-CM

## 2014-12-16 NOTE — Progress Notes (Signed)
Dr. Karleen Hampshire T. Ramesh Moan, MD, CAQ Sports Medicine Primary Care and Sports Medicine [address] [place], [address] Phone: [phone] Fax: [phone]  12/16/2014  Patient: Darlene Baker, MRN: [id number], DOB: [birth year], 29 y.o.  Primary Physician:  Hannah Beat, MD  Chief Complaint: Follow-up  Subjective:   Darlene Baker is a 29 y.o. very pleasant female patient who presents with the following:  Knee, feels about the same. Does not really hurt, but not the same. Her effusion has gone down, and she has been able to resume running. She has running now approximately 1 mile at a time. Previously she was running about 2 or 3 miles per time she went running.  She also has been intermittently somnolent for the last few years, she sometimes falls asleep at her desk. She worries about potentially some sleep apnea, and she feels tired most of the time during the day.  Had been running every day.  Working at [employer] - sitting down all day.     12/02/2014 Last OV with Hannah Beat, MD  Few weeks ago on labor day, about a month ago, squatting down and then could not get up and knee popped and felt ok. Same thing happened 2 or 3 times, then started to gradually swell up.   Feels a lot better, still hurts some and some like it will give out.  Ice pack at night Hinged compression brace.   Past Medical History, Surgical History, Social History, Family History, Problem List, Medications, and Allergies have been reviewed and updated if relevant.  Patient Active Problem List   Diagnosis Date Noted  . Right anterior knee pain 11/19/2014  . Epigastric abdominal pain 11/19/2014  . 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 Chronic Suboxone maintenance 10/15/2014  . 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 TOBACCO USE 05/09/2009  . HEADACHE, CHRONIC 05/09/2009  . REDUCTION MAMMOPLASTY, HX OF 05/09/2009    Past Medical History    Diagnosis Date  . Migraine   . 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 Chronic Suboxone maintenance 10/15/2014    Past Surgical History  Procedure Laterality Date  . Breast surgery      reduction in 2010    Social History   Social History  . Marital Status: Single    Spouse Name: N/A  . Number of Children: N/A  . Years of Education: N/A   Occupational History  . nursing student    Social History Main Topics  . Smoking status: Current Every Day Smoker -- 1.00 packs/day    Types: Cigarettes  . Smokeless tobacco: Never Used  . Alcohol Use: 0.0 oz/week    0 Standard drinks or equivalent per week  . Drug Use: No     Comment: heroin  . Sexual Activity: Not on file   Other Topics Concern  . Not on file   Social History Narrative   Regular exercise-no    No family history on file.  No Known Allergies  Medication list reviewed and updated in full in [facility].  GEN: No fevers, chills. Nontoxic. Primarily MSK c/o today. MSK: Detailed in the HPI GI: tolerating PO intake without difficulty Neuro: No numbness, parasthesias, or tingling associated. Otherwise the pertinent positives of the ROS are noted above.   Objective:   BP 90/66 mmHg  Pulse 59  Temp(Src) 97.9 F (36.6 C) (Oral)  Ht  (1.6 m)  Wt 167 lb 12 oz (76.091  kg)  BMI 29.72 kg/m2  LMP 11/30/2014   GEN: WDWN, NAD, Non-toxic, Alert & Oriented x 3 HEENT: Atraumatic, Normocephalic.  Ears and Nose: No external deformity. EXTR: No clubbing/cyanosis/edema NEURO: Normal gait.  PSYCH: Normally interactive. Conversant. Not depressed or anxious appearing.  Calm demeanor.   Knee:  R Gait: Normal heel toe pattern ROM: 0-125 Effusion: minimal Echymosis or edema: none Patellar tendon NT Painful PLICA: neg Patellar grind: negative Medial and lateral patellar facet loading: negative medial and lateral joint lines: NT Mcmurray's neg Flexion-pinch neg Varus and valgus stress: stable Lachman:  neg Ant and Post drawer: neg Hip abduction, IR, ER: WNL Hip flexion str: 5/5 Hip abd: 5/5 Quad: 5/5 VMO atrophy:No Hamstring concentric and eccentric: 5/5   Radiology: Dg Knee Complete 4 Views Right  11/19/2014   CLINICAL DATA:  Right knee pain after squatting yesterday, felt a pop  EXAM: RIGHT KNEE - COMPLETE 4+ VIEW  COMPARISON:  None.  FINDINGS: Five views of the right knee submitted. No acute fracture or subluxation. Small joint effusion.  IMPRESSION: No fracture or subluxation.  Small joint effusion.   Electronically Signed   By: Natasha Mead M.D.   On: 11/19/2014 10:38    Assessment and Plan:   Internal derangement of right knee  Disturbance in sleep behavior - Plan: Ambulatory referral to Pulmonology   Right is quite a bit better. Minimally provokable on exam and her effusion is basically gone. At this point I would resume running and gradually increase the mileage.  I gave her a note to delay her PT testing for the [employer].  Potentially, the patient also does have some sleep apnea or some other sleep disturbance going on. Consult sleep medicine for their opinion  Follow-up: prn  New Prescriptions   No medications on file   Orders Placed This Encounter  Procedures  . Ambulatory referral to Pulmonology    Signed,  Karleen Hampshire T. Ariabella Brien, MD   Patient's Medications  New Prescriptions   No medications on file  Previous Medications   BUPRENORPHINE-NALOXONE (SUBOXONE) 8-2 MG SUBL SL TABLET    Place 1.5 tablets under the tongue daily.  Modified Medications   No medications on file  Discontinued Medications   DICLOFENAC (VOLTAREN) 75 MG EC TABLET    Take 1 tablet (75 mg total) by mouth 2 (two) times daily.

## 2014-12-16 NOTE — Progress Notes (Signed)
Pre visit review using our clinic review tool, if applicable. No additional management support is needed unless otherwise documented below in the visit note. 

## 2015-01-27 ENCOUNTER — Ambulatory Visit
Admission: RE | Admit: 2015-01-27 | Discharge: 2015-01-27 | Disposition: A | Discharge status: Home / Self Care | Source: Ambulatory Visit | Attending: Internal Medicine | Admitting: Internal Medicine

## 2015-01-27 ENCOUNTER — Other Ambulatory Visit: Admission: RE | Admit: 2015-01-27 | Payer: Self-pay | Source: Ambulatory Visit | Admitting: Internal Medicine

## 2015-01-27 ENCOUNTER — Encounter: Payer: Self-pay | Admitting: Internal Medicine

## 2015-01-27 ENCOUNTER — Ambulatory Visit (INDEPENDENT_AMBULATORY_CARE_PROVIDER_SITE_OTHER): Admitting: Internal Medicine

## 2015-01-27 VITALS — BP 110/76 | HR 90 | Ht 63.0 in | Wt 170.0 lb

## 2015-01-27 DIAGNOSIS — F1120 Opioid dependence, uncomplicated: Secondary | ICD-10-CM | POA: Diagnosis present

## 2015-01-27 DIAGNOSIS — G4719 Other hypersomnia: Secondary | ICD-10-CM

## 2015-01-27 DIAGNOSIS — Z0189 Encounter for other specified special examinations: Secondary | ICD-10-CM | POA: Insufficient documentation

## 2015-01-27 DIAGNOSIS — F1129 Opioid dependence with unspecified opioid-induced disorder: Secondary | ICD-10-CM | POA: Diagnosis not present

## 2015-01-27 DIAGNOSIS — G4710 Hypersomnia, unspecified: Secondary | ICD-10-CM | POA: Diagnosis present

## 2015-01-27 LAB — BLOOD GAS, ARTERIAL
Acid-Base Excess: 0.1 mmol/L (ref 0.0–3.0)
Allens test (pass/fail): POSITIVE — AB
BICARBONATE: 24.7 meq/L (ref 21.0–28.0)
FIO2: 21
O2 SAT: 97.1 %
PATIENT TEMPERATURE: 37
PO2 ART: 91 mmHg (ref 83.0–108.0)
pCO2 arterial: 39 mmHg (ref 32.0–48.0)
pH, Arterial: 7.41 (ref 7.350–7.450)

## 2015-01-27 NOTE — Addendum Note (Signed)
Addended by: Meyer CoryAHMAD, MISTY R on: 01/27/2015 11:18 AM   Modules accepted: Orders

## 2015-01-27 NOTE — Progress Notes (Signed)
[facility] Consultation      Assessment and Plan:  Excessive daytime sleepiness -We'll send for sleep study to rule out obstructive sleep apnea as a cause of her excessive daytime sleepiness. -Given her history of opioid dependence and current Suboxone use. This could be causing daytime sleepiness, [name], [name], i would recommend trying to move the suboxone to later in the day or at bedtime.   Date: 01/27/2015  MRN# [id number] Darlene Baker [birth year]  Referring Physician:  Kerin PernaSpencer Baker.   Darlene Baker is a [age] y.o. old female seen in consultation for chief complaint of:   No chief complaint on file.   HPI:   The patient is a 29 year old female referred for excessive daytime sleepiness. She is a history of heroin addiction, is on chronic Suboxone maintenance.  The patient notes problems staying asleep, she typically goes to bed between 10:30 and 11:30 PM and takes about 30 minutes to fall asleep. She wakes up 2-3 times per night, she wakes up and gets out of bed around 6 AM. She is gained up to 30 pounds over the last 2 years, she has no previous known history of CPAP or sleep studies done in the past. She describes herself as very likely to dose if sitting and reading or sitting inactive in a public place. Her Epworth score is 17.  She notes the trouble is going on about 2 years. She lost her previous job from falling asleep at her desk, and she has already been getting into trouble because of this at her new job. She is prescribed suboxone by her doc at the [facility]. She takes it at different times, she is more tired if she takes it in the morning. She finds that she gets  withdrawal symptoms if she waits to take it later in the day.  She does not snore at night.   She does not nap during the day, but she thinks she would if she had the chance. She notes that she frequently dozes off at work. She works at [employer] at [employer].   She takes no other medications otc.   PMHX:   Past Medical History  Diagnosis Date  . Migraine   . 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 Chronic Suboxone maintenance 10/15/2014   Surgical Hx:  Past Surgical History  Procedure Laterality Date  . Breast surgery      reduction in 2010   Family Hx:  No family history on file. Social Hx:   Social History  Substance Use Topics  . Smoking status: Current Every Day Smoker -- 1.00 packs/day    Types: Cigarettes  . Smokeless tobacco: Never Used  . Alcohol Use: 0.0 oz/week    0 Standard drinks or equivalent per week   Medication:   Current Outpatient Rx  Name  Route  Sig  Dispense  Refill  . buprenorphine-naloxone (SUBOXONE) 8-2 MG SUBL SL tablet   Sublingual   Place 1.5 tablets under the tongue daily.             Allergies:  Review of patient's allergies indicates no known allergies.  Review of Systems: Gen:  Denies  fever, sweats, chills HEENT:  Denies blurred vision, double vision. bleeds, sore throat Cvc:  No dizziness, chest pain. Resp:   Denies cough or sputum porduction,  Gi: Denies swallowing difficulty, stomach pain. Gu:  Denies bladder incontinence, burning urine Ext:   No Joint pain, stiffness. Skin: No skin rash,  hives Endoc:  No polyuria, polydipsia. Psych: No depression, insomnia. Other:  All other systems were reviewed with the patient and were negative other that what is mentioned in the HPI.   Physical Examination:   VS: There were no vitals taken for this visit.  General Appearance: No distress  Neuro:without focal findings,  speech normal,  HEENT: PERRLA, EOM intact.   Pulmonary: normal breath sounds, No wheezing.    CardiovascularNormal S1,S2.  No m/r/g.   Abdomen: Benign, Soft, non-tender. Renal:  No costovertebral tenderness  GU:  No performed at this time. Endoc: No evident thyromegaly,  Skin:   warm, no rashes, no ecchymosis  Extremities: normal, no cyanosis, clubbing.  Other findings:    LABORATORY PANEL:   CBC No results for input(s): WBC, HGB, HCT, PLT in the last 168 hours. ------------------------------------------------------------------------------------------------------------------  Chemistries  No results for input(s): NA, K, CL, CO2, GLUCOSE, BUN, CREATININE, CALCIUM, MG, AST, ALT, ALKPHOS, BILITOT in the last 168 hours.  Invalid input(s): GFRCGP ------------------------------------------------------------------------------------------------------------------  Cardiac Enzymes No results for input(s): TROPONINI in the last 168 hours. ------------------------------------------------------------  RADIOLOGY:  No results found.     Thank  you for the consultation and for allowing Darlene Baker to assist in the Baker of your patient. Our recommendations are noted above.  Please contact us if we can be of further service.   Darlene Guiles, MD.  Board Certified in Internal Medicine, Pulmonary Medicine, Critical Baker Medicine, and Sleep Medicine.  Darlene Baker: [phone]  Darlene Baker, M.D.  Darlene Baker, M.D.  Darlene Baker, M.D

## 2015-01-27 NOTE — Patient Instructions (Signed)
--  Follow up in 3 months if the sleep test is positive.    Sleep Apnea Sleep apnea is disorder that affects a person's sleep. A person with sleep apnea has abnormal pauses in their breathing when they sleep. It is hard for them to get a good sleep. This makes a person tired during the day. It also can lead to other physical problems. There are three types of sleep apnea. One type is when breathing stops for a short time because your airway is blocked (obstructive sleep apnea). Another type is when the brain sometimes fails to give the normal signal to breathe to the muscles that control your breathing (central sleep apnea). The third type is a combination of the other two types. HOME CARE   Take all medicine as told by your doctor.  Avoid alcohol, calming medicines (sedatives), and depressant drugs.  Try to lose weight if you are overweight. Talk to your doctor about a healthy weight goal.  Your doctor may have you use a device that helps to open your airway. It can help you get the air that you need. It is called a positive airway pressure (PAP) device.   MAKE SURE YOU:   Understand these instructions.  Will watch your condition.  Will get help right away if you are not doing well or get worse.  It may take approximately 1 month for you to get used to wearing her CPAP every night.  Be sure to work with

## 2015-01-27 NOTE — Addendum Note (Signed)
Addended by: Meyer CoryAHMAD, Laiana Fratus R on: 01/27/2015 11:51 AM   Modules accepted: Orders

## 2015-01-27 NOTE — Addendum Note (Signed)
Addended by: Meyer CoryAHMAD, MISTY R on: 01/27/2015 11:21 AM   Modules accepted: Orders

## 2015-01-29 ENCOUNTER — Telehealth: Payer: Self-pay | Admitting: *Deleted

## 2015-01-29 DIAGNOSIS — G4719 Other hypersomnia: Secondary | ICD-10-CM

## 2015-01-29 NOTE — Telephone Encounter (Signed)
Order placed for HST. Nothing further needed. ?

## 2015-01-29 NOTE — Telephone Encounter (Signed)
-----   Message from Ollen Grosshonda J Cobb sent at 01/28/2015  3:08 PM EST ----- Regarding: pt requesting HST instead of in lab study Misty will you place a HST order in for the above patient. DR said he didn't care whichever the patient and insurance would approve.  Thanks, Bjorn Loserhonda

## 2015-03-12 ENCOUNTER — Ambulatory Visit (INDEPENDENT_AMBULATORY_CARE_PROVIDER_SITE_OTHER): Admitting: Family Medicine

## 2015-03-12 ENCOUNTER — Encounter: Payer: Self-pay | Admitting: Family Medicine

## 2015-03-12 VITALS — BP 100/68 | HR 79 | Temp 97.8°F | Ht 63.0 in | Wt 174.8 lb

## 2015-03-12 DIAGNOSIS — N649 Disorder of breast, unspecified: Secondary | ICD-10-CM

## 2015-03-12 DIAGNOSIS — L988 Other specified disorders of the skin and subcutaneous tissue: Secondary | ICD-10-CM

## 2015-03-12 DIAGNOSIS — L989 Disorder of the skin and subcutaneous tissue, unspecified: Secondary | ICD-10-CM

## 2015-03-12 NOTE — Progress Notes (Signed)
Dr. Karleen Hampshire T. Ruthanna Macchia, MD, CAQ Sports Medicine [facility] [address] [place], [address] Phone: [phone] Fax: [phone]  03/12/2015  Patient: Darlene Baker, MRN: [id number], DOB: 02/18/1986, 29 y.o.  Primary Physician:  Hannah Beat, MD   Chief Complaint  Patient presents with  . Nevus    on Back-Wants removed   Subjective:   Darlene Baker is a 29 y.o. very pleasant female patient who presents with the following:  Procedure visit only  Lesion on back - requests removal. Nevus vs seb k  And small one on R chest.   Shave x 2  Past Medical History, Surgical History, Social History, Family History, Problem List, Medications, and Allergies have been reviewed and updated if relevant.  Patient Active Problem List   Diagnosis Date Noted  . Right anterior knee pain 11/19/2014  . Epigastric abdominal pain 11/19/2014  . 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 Chronic Suboxone maintenance 10/15/2014  . 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 TOBACCO USE 05/09/2009  . HEADACHE, CHRONIC 05/09/2009  . REDUCTION MAMMOPLASTY, HX OF 05/09/2009    Past Medical History  Diagnosis Date  . Migraine   . 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 Chronic Suboxone maintenance 10/15/2014    Past Surgical History  Procedure Laterality Date  . Breast surgery      reduction in 2010    Social History   Social History  . Marital Status: Single    Spouse Name: N/A  . Number of Children: N/A  . Years of Education: N/A   Occupational History  . nursing student    Social History Main Topics  . Smoking status: Current Every Day Smoker -- 1.00 packs/day    Types: Cigarettes  . Smokeless tobacco: Never Used  . Alcohol Use: 0.0 oz/week    0 Standard drinks or equivalent per week  . Drug Use: No     Comment: heroin  . Sexual Activity: Not on file   Other Topics Concern  . Not on file    Social History Narrative   Regular exercise-no    Family History  Problem Relation Age of Onset  . Family history unknown: Yes    No Known Allergies  Medication list reviewed and updated in full in [facility].   GEN: No acute illnesses, no fevers, chills. GI: No n/v/d, eating normally Pulm: No SOB Interactive and getting along well at home.  Otherwise, ROS is as per the HPI.  Objective:   BP 100/68 mmHg  Pulse 79  Temp(Src) 97.8 F (36.6 C) (Oral)  Ht  (1.6 m)  Wt 174 lb 12 oz (79.266 kg)  BMI 30.96 kg/m2  LMP 03/03/2015  GEN: WDWN, NAD, Non-toxic, A & O x 3 HEENT: Atraumatic, Normocephalic. Neck supple. No masses, No LAD. Ears and Nose: No external deformity. EXTR: No c/c/e NEURO Normal gait.  PSYCH: Normally interactive. Conversant. Not depressed or anxious appearing.  Calm demeanor.   Upper back with 8 mm raised lesion, irritated  R upper chest with 3 mm raised lesion  Laboratory and Imaging Data:  Assessment and Plan:   Skin lesion of back  Skin lesion of breast  Back: seb k vs nevus vs less likely scc  Upper chest: nevus vs. Possible bcc vs less likely melanoma  Path to follow  Shave biopsy Indication: suspicious lesion Location: upper back Size: 8 mm Verbal informed consent obtained.  Pt aware of risks not limited to but including infection, bleeding, damage to near by organs. Prep: etoh/betadine Anesthesia: 1%lidocaine with epi, good effect Shave made with dermablade Minimal oozing, controlled with silver nitrate Tolerated well Routine postprocedure instructions d/w pt- keep area clean and bandaged, follow up if concerns/spreading erythema/pain.   Shave biopsy Indication: suspicious lesion Location: R upper chest Size: 3 mm Verbal informed consent obtained.  Pt aware of risks not limited to but including infection, bleeding, damage to near by organs. Prep: etoh/betadine Anesthesia: 1%lidocaine with epi, good  effect Shave made with dermablade Minimal oozing, controlled with silver nitrate Tolerated well Routine postprocedure instructions d/w pt- keep area clean and bandaged, follow up if concerns/spreading erythema/pain.   Follow-up: reviewed basic aftercare  Signed,  Karleen HampshireSpencer T. Pyper Olexa, MD   Patient's Medications  New Prescriptions   No medications on file  Previous Medications   BUPRENORPHINE-NALOXONE (SUBOXONE) 8-2 MG SUBL SL TABLET    Place 1.5 tablets under the tongue daily.  Modified Medications   No medications on file  Discontinued Medications   No medications on file

## 2015-03-12 NOTE — Progress Notes (Signed)
Pre visit review using our clinic review tool, if applicable. No additional management support is needed unless otherwise documented below in the visit note. 

## 2015-03-20 DIAGNOSIS — G4733 Obstructive sleep apnea (adult) (pediatric): Secondary | ICD-10-CM | POA: Diagnosis not present

## 2015-03-25 DIAGNOSIS — G4733 Obstructive sleep apnea (adult) (pediatric): Secondary | ICD-10-CM | POA: Diagnosis not present

## 2015-04-01 ENCOUNTER — Other Ambulatory Visit: Payer: Self-pay | Admitting: *Deleted

## 2015-04-01 DIAGNOSIS — G4719 Other hypersomnia: Secondary | ICD-10-CM

## 2015-04-25 ENCOUNTER — Ambulatory Visit: Admitting: Internal Medicine

## 2015-11-05 ENCOUNTER — Other Ambulatory Visit: Payer: Self-pay | Admitting: Obstetrics

## 2015-11-06 LAB — CYTOLOGY - PAP

## 2015-11-11 ENCOUNTER — Encounter: Payer: Self-pay | Admitting: Primary Care

## 2015-11-11 ENCOUNTER — Ambulatory Visit (INDEPENDENT_AMBULATORY_CARE_PROVIDER_SITE_OTHER): Admitting: Primary Care

## 2015-11-11 VITALS — BP 112/78 | HR 78 | Temp 98.2°F | Ht 63.0 in | Wt 173.8 lb

## 2015-11-11 DIAGNOSIS — J302 Other seasonal allergic rhinitis: Secondary | ICD-10-CM

## 2015-11-11 NOTE — Progress Notes (Signed)
Pre visit review using our clinic review tool, if applicable. No additional management support is needed unless otherwise documented below in the visit note. 

## 2015-11-11 NOTE — Progress Notes (Signed)
Subjective:    Patient ID: Darlene Baker, female    DOB: September 28, 1985, 30 y.o.   MRN: [id number]  HPI  Darlene Baker is a 30 year old female who presents today with a chief complaint of nasal congestion. She also reports ear pain bilaterally, jaw discomfort, headache, and sinus pressure. Her symptoms began yesterday afternoon. Denies fevers, cough, sick contacts, dental pain. Her most bothersome symptom is jaw pain. She's not taken anything OTC for her symptoms.   Review of Systems  Constitutional: Negative for chills, fatigue and fever.  HENT: Positive for congestion, ear pain and sinus pressure. Negative for postnasal drip and sore throat.   Respiratory: Negative for cough and shortness of breath.   Cardiovascular: Negative for chest pain.       Past Medical History:  Diagnosis Date  . Chronic Suboxone maintenance 10/15/2014  . 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 Migraine      Social History   Social History  . Marital status: Single    Spouse name: N/A  . Number of children: N/A  . Years of education: N/A   Occupational History  . nursing student    Social History Main Topics  . Smoking status: Current Every Day Smoker    Packs/day: 1.00    Types: Cigarettes  . Smokeless tobacco: Never Used  . Alcohol use 0.0 oz/week  . Drug use: No     Comment: heroin  . Sexual activity: Not on file   Other Topics Concern  . Not on file   Social History Narrative   Regular exercise-no    Past Surgical History:  Procedure Laterality Date  . BREAST SURGERY     reduction in 2010    Family History  Problem Relation Age of Onset  . Family history unknown: Yes    No Known Allergies  Current Outpatient Prescriptions on File Prior to Visit  Medication Sig Dispense Refill  . buprenorphine-naloxone (SUBOXONE) 8-2 MG SUBL SL tablet Place 1.5 tablets under the tongue daily.     No current facility-administered medications on file prior to visit.     BP 112/78   Pulse  78   Temp 98.2 F (36.8 C) (Oral)   Ht 5\' 3"  (1.6 m)   Wt 173 lb 12.8 oz (78.8 kg)   LMP 10/27/2015   SpO2 94%   BMI 30.79 kg/m    Objective:   Physical Exam  Constitutional: She appears well-nourished.  HENT:  Right Ear: Ear canal normal. Tympanic membrane is bulging. Tympanic membrane is not erythematous.  Left Ear: Ear canal normal. Tympanic membrane is not erythematous. A middle ear effusion is present.  Nose: Right sinus exhibits no maxillary sinus tenderness and no frontal sinus tenderness. Left sinus exhibits no maxillary sinus tenderness and no frontal sinus tenderness.  Mouth/Throat: Oropharynx is clear and moist.  Eyes: Conjunctivae are normal.  Neck: Neck supple.  Cardiovascular: Normal rate and regular rhythm.   Pulmonary/Chest: Effort normal and breath sounds normal. She has no wheezes. She has no rales.  Lymphadenopathy:    She has no cervical adenopathy.  Skin: Skin is warm and dry.          Assessment & Plan:  Allergic Rhinitis:  Nasal congestion, sinus pressure, ear pressure, jaw pain x 24 hours. No fevers, chills, systemic s/s of acute infection. Likely due to temperature and humidity changes in weather. Exam today without evidence of bacterial involvement. Vitals stable, lungs clear. Treat with daily antihistamine and Flonase for  ear effusion and pressure. Return precautions provided.  Morrie Sheldonlark,Denys Salinger Kendal, NP

## 2015-11-11 NOTE — Patient Instructions (Signed)
Start a daily antihistamine such as Claritin, Zyrtec, Allegra to help dry up the fluid in your ears that is causing pressure.  Nasal Congestion/Ear Pressure: Try using Flonase (fluticasone) nasal spray. Instill 1 spray in each nostril twice daily.  Please notify me if you develop fevers, chills, increased pain.  It was a pleasure meeting you!

## 2018-10-13 ENCOUNTER — Ambulatory Visit
Admission: RE | Admit: 2018-10-13 | Discharge: 2018-10-13 | Disposition: A | Discharge status: Home / Self Care | Source: Ambulatory Visit | Attending: Obstetrics | Admitting: Obstetrics

## 2018-10-13 ENCOUNTER — Other Ambulatory Visit: Payer: Self-pay | Admitting: Obstetrics

## 2018-10-13 DIAGNOSIS — T185XXA Foreign body in anus and rectum, initial encounter: Secondary | ICD-10-CM

## 2018-11-14 ENCOUNTER — Other Ambulatory Visit: Payer: Self-pay | Admitting: Emergency Medicine

## 2018-11-14 DIAGNOSIS — Z20822 Contact with and (suspected) exposure to covid-19: Secondary | ICD-10-CM

## 2018-11-16 LAB — NOVEL CORONAVIRUS, NAA: SARS-CoV-2, NAA: NOT DETECTED

## 2019-08-16 ENCOUNTER — Emergency Department (HOSPITAL_COMMUNITY)

## 2019-08-16 ENCOUNTER — Encounter (HOSPITAL_COMMUNITY): Payer: Self-pay | Admitting: Emergency Medicine

## 2019-08-16 ENCOUNTER — Emergency Department (HOSPITAL_COMMUNITY)
Admission: EM | Admit: 2019-08-16 | Discharge: 2019-08-16 | Disposition: A | Discharge status: Home / Self Care | Attending: Emergency Medicine | Admitting: Emergency Medicine

## 2019-08-16 ENCOUNTER — Other Ambulatory Visit: Payer: Self-pay

## 2019-08-16 DIAGNOSIS — Y92410 Unspecified street and highway as the place of occurrence of the external cause: Secondary | ICD-10-CM | POA: Diagnosis not present

## 2019-08-16 DIAGNOSIS — S0101XA Laceration without foreign body of scalp, initial encounter: Secondary | ICD-10-CM | POA: Diagnosis not present

## 2019-08-16 DIAGNOSIS — S27322A Contusion of lung, bilateral, initial encounter: Secondary | ICD-10-CM | POA: Diagnosis not present

## 2019-08-16 DIAGNOSIS — S299XXA Unspecified injury of thorax, initial encounter: Secondary | ICD-10-CM | POA: Diagnosis present

## 2019-08-16 DIAGNOSIS — T07XXXA Unspecified multiple injuries, initial encounter: Secondary | ICD-10-CM

## 2019-08-16 DIAGNOSIS — S2222XA Fracture of body of sternum, initial encounter for closed fracture: Secondary | ICD-10-CM | POA: Insufficient documentation

## 2019-08-16 DIAGNOSIS — Z79899 Other long term (current) drug therapy: Secondary | ICD-10-CM | POA: Insufficient documentation

## 2019-08-16 DIAGNOSIS — R7989 Other specified abnormal findings of blood chemistry: Secondary | ICD-10-CM | POA: Diagnosis not present

## 2019-08-16 DIAGNOSIS — R778 Other specified abnormalities of plasma proteins: Secondary | ICD-10-CM

## 2019-08-16 DIAGNOSIS — T148XXA Other injury of unspecified body region, initial encounter: Secondary | ICD-10-CM | POA: Insufficient documentation

## 2019-08-16 DIAGNOSIS — S01112A Laceration without foreign body of left eyelid and periocular area, initial encounter: Secondary | ICD-10-CM | POA: Diagnosis not present

## 2019-08-16 DIAGNOSIS — R1013 Epigastric pain: Secondary | ICD-10-CM | POA: Diagnosis not present

## 2019-08-16 DIAGNOSIS — Y999 Unspecified external cause status: Secondary | ICD-10-CM | POA: Insufficient documentation

## 2019-08-16 DIAGNOSIS — Y9389 Activity, other specified: Secondary | ICD-10-CM | POA: Insufficient documentation

## 2019-08-16 LAB — CBC WITH DIFFERENTIAL/PLATELET
Abs Immature Granulocytes: 0.05 10*3/uL (ref 0.00–0.07)
Basophils Absolute: 0 10*3/uL (ref 0.0–0.1)
Basophils Relative: 0 %
Eosinophils Absolute: 0 10*3/uL (ref 0.0–0.5)
Eosinophils Relative: 0 %
HCT: 39.8 % (ref 36.0–46.0)
Hemoglobin: 13.1 g/dL (ref 12.0–15.0)
Immature Granulocytes: 0 %
Lymphocytes Relative: 12 %
Lymphs Abs: 1.7 10*3/uL (ref 0.7–4.0)
MCH: 30.4 pg (ref 26.0–34.0)
MCHC: 32.9 g/dL (ref 30.0–36.0)
MCV: 92.3 fL (ref 80.0–100.0)
Monocytes Absolute: 1.4 10*3/uL — ABNORMAL HIGH (ref 0.1–1.0)
Monocytes Relative: 9 %
Neutro Abs: 11.4 10*3/uL — ABNORMAL HIGH (ref 1.7–7.7)
Neutrophils Relative %: 79 %
Platelets: 313 10*3/uL (ref 150–400)
RBC: 4.31 MIL/uL (ref 3.87–5.11)
RDW: 11.4 % — ABNORMAL LOW (ref 11.5–15.5)
WBC: 14.6 10*3/uL — ABNORMAL HIGH (ref 4.0–10.5)
nRBC: 0 % (ref 0.0–0.2)

## 2019-08-16 LAB — URINALYSIS, ROUTINE W REFLEX MICROSCOPIC
Bilirubin Urine: NEGATIVE
Glucose, UA: NEGATIVE mg/dL
Ketones, ur: NEGATIVE mg/dL
Nitrite: NEGATIVE
Protein, ur: NEGATIVE mg/dL
Specific Gravity, Urine: 1.003 — ABNORMAL LOW (ref 1.005–1.030)
pH: 5 (ref 5.0–8.0)

## 2019-08-16 LAB — RAPID URINE DRUG SCREEN, HOSP PERFORMED
Amphetamines: POSITIVE — AB
Barbiturates: NOT DETECTED
Benzodiazepines: NOT DETECTED
Cocaine: NOT DETECTED
Opiates: NOT DETECTED
Tetrahydrocannabinol: NOT DETECTED

## 2019-08-16 LAB — COMPREHENSIVE METABOLIC PANEL
ALT: 50 U/L — ABNORMAL HIGH (ref 0–44)
AST: 118 U/L — ABNORMAL HIGH (ref 15–41)
Albumin: 3.7 g/dL (ref 3.5–5.0)
Alkaline Phosphatase: 39 U/L (ref 38–126)
Anion gap: 12 (ref 5–15)
BUN: 9 mg/dL (ref 6–20)
CO2: 23 mmol/L (ref 22–32)
Calcium: 8.5 mg/dL — ABNORMAL LOW (ref 8.9–10.3)
Chloride: 106 mmol/L (ref 98–111)
Creatinine, Ser: 0.84 mg/dL (ref 0.44–1.00)
GFR calc Af Amer: >60 mL/min (ref >60)
GFR calc non Af Amer: >60 mL/min (ref >60)
Glucose, Bld: 103 mg/dL — ABNORMAL HIGH (ref 70–99)
Potassium: 3.4 mmol/L — ABNORMAL LOW (ref 3.5–5.1)
Sodium: 141 mmol/L (ref 135–145)
Total Bilirubin: 0.5 mg/dL (ref 0.3–1.2)
Total Protein: 6.3 g/dL — ABNORMAL LOW (ref 6.5–8.1)

## 2019-08-16 LAB — I-STAT BETA HCG BLOOD, ED (MC, WL, AP ONLY): I-stat hCG, quantitative: <5 m[IU]/mL (ref <5)

## 2019-08-16 LAB — TROPONIN I (HIGH SENSITIVITY)
Troponin I (High Sensitivity): 15 ng/L (ref <18)
Troponin I (High Sensitivity): 42 ng/L — ABNORMAL HIGH (ref <18)

## 2019-08-16 LAB — ETHANOL: Alcohol, Ethyl (B): 95 mg/dL — ABNORMAL HIGH (ref <10)

## 2019-08-16 MED ORDER — OXYCODONE-ACETAMINOPHEN 5-325 MG PO TABS
1.0000 | ORAL_TABLET | Freq: Once | ORAL | Status: AC
  Administered 2019-08-16: 1 via ORAL
  Filled 2019-08-16: qty 1

## 2019-08-16 MED ORDER — FENTANYL CITRATE (PF) 100 MCG/2ML IJ SOLN
50.0000 ug | Freq: Once | INTRAMUSCULAR | Status: AC
  Administered 2019-08-16: 50 ug via INTRAVENOUS
  Filled 2019-08-16: qty 2

## 2019-08-16 MED ORDER — IBUPROFEN 600 MG PO TABS
600.0000 mg | ORAL_TABLET | Freq: Four times a day (QID) | ORAL | 0 refills | Status: DC | PRN
Start: 2019-08-16 — End: 2020-04-28

## 2019-08-16 MED ORDER — LIDOCAINE-EPINEPHRINE (PF) 2 %-1:200000 IJ SOLN
10.0000 mL | Freq: Once | INTRAMUSCULAR | Status: AC
  Administered 2019-08-16: 10 mL
  Filled 2019-08-16: qty 20

## 2019-08-16 MED ORDER — SODIUM CHLORIDE 0.9 % IV BOLUS
1000.0000 mL | Freq: Once | INTRAVENOUS | Status: AC
  Administered 2019-08-16: 1000 mL via INTRAVENOUS

## 2019-08-16 MED ORDER — MORPHINE SULFATE (PF) 4 MG/ML IV SOLN
4.0000 mg | Freq: Once | INTRAVENOUS | Status: AC
  Administered 2019-08-16: 4 mg via INTRAVENOUS
  Filled 2019-08-16: qty 1

## 2019-08-16 MED ORDER — IOHEXOL 300 MG/ML  SOLN
100.0000 mL | Freq: Once | INTRAMUSCULAR | Status: AC | PRN
  Administered 2019-08-16: 100 mL via INTRAVENOUS

## 2019-08-16 MED ORDER — TETANUS-DIPHTH-ACELL PERTUSSIS 5-2.5-18.5 LF-MCG/0.5 IM SUSP
0.5000 mL | Freq: Once | INTRAMUSCULAR | Status: AC
  Administered 2019-08-16: 0.5 mL via INTRAMUSCULAR
  Filled 2019-08-16: qty 0.5

## 2019-08-16 MED ORDER — OXYCODONE-ACETAMINOPHEN 5-325 MG PO TABS: 1.0000 | ORAL_TABLET | Freq: Four times a day (QID) | ORAL | 0 refills | Status: DC | PRN
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
## 2019-08-16 NOTE — ED Notes (Signed)
REPAGED DR BYERLY TO PA FORD [phone]--Enjoli Tidd

## 2019-08-16 NOTE — ED Notes (Signed)
Pt to ct 
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
## 2019-08-16 NOTE — ED Notes (Signed)
Pt verbalized understanding of d/c instructions, follow up care, signs and symptoms requiring return to ed and prescriptions. Pt had no further questions at this time. Pt transported via wheelchair to exit.

## 2019-08-16 NOTE — ED Notes (Signed)
PAGED DR BYERLY TO [facility] FORD--Darlene Baker

## 2019-08-22 ENCOUNTER — Ambulatory Visit (HOSPITAL_COMMUNITY)
Admission: EM | Admit: 2019-08-22 | Discharge: 2019-08-22 | Disposition: A | Discharge status: Home / Self Care | Attending: Family Medicine | Admitting: Family Medicine

## 2019-08-22 ENCOUNTER — Encounter (HOSPITAL_COMMUNITY): Payer: Self-pay | Admitting: Emergency Medicine

## 2019-08-22 ENCOUNTER — Other Ambulatory Visit: Payer: Self-pay

## 2019-08-22 DIAGNOSIS — M25361 Other instability, right knee: Secondary | ICD-10-CM | POA: Diagnosis not present

## 2019-08-22 DIAGNOSIS — S0101XA Laceration without foreign body of scalp, initial encounter: Secondary | ICD-10-CM

## 2019-08-22 DIAGNOSIS — S0181XA Laceration without foreign body of other part of head, initial encounter: Secondary | ICD-10-CM

## 2019-08-22 DIAGNOSIS — Z4802 Encounter for removal of sutures: Secondary | ICD-10-CM
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
## 2019-11-07 ENCOUNTER — Encounter: Payer: Self-pay | Admitting: Orthopedic Surgery

## 2019-11-07 ENCOUNTER — Other Ambulatory Visit
Admission: RE | Admit: 2019-11-07 | Discharge: 2019-11-07 | Disposition: A | Discharge status: Home / Self Care | Source: Ambulatory Visit | Attending: Orthopedic Surgery | Admitting: Orthopedic Surgery

## 2019-11-07 ENCOUNTER — Other Ambulatory Visit: Payer: Self-pay

## 2019-11-07 ENCOUNTER — Other Ambulatory Visit: Payer: Self-pay | Admitting: Orthopedic Surgery

## 2019-11-07 DIAGNOSIS — Z01812 Encounter for preprocedural laboratory examination: Secondary | ICD-10-CM | POA: Insufficient documentation

## 2019-11-07 NOTE — H&P (Signed)
Darlene Baker MRN:  [id number] DOB/SEX:  Jan 30, 1986/female  CHIEF COMPLAINT:  Painful right Knee  HISTORY: Patient is a 34 y.o. female presented with a history of pain in the right knee. Onset of symptoms was abrupt starting several months ago with gradually worsening course since that time. Prior procedures on the knee include none. Patient has been treated conservatively with over-the-counter NSAIDs and activity modification. Patient currently rates pain in the knee at 9 out of 10 with activity. There is pain at night.  PAST MEDICAL HISTORY: Patient Active Problem List   Diagnosis Date Noted  . Right anterior knee pain 11/19/2014  . Epigastric abdominal pain 11/19/2014  . 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 Chronic Suboxone maintenance 10/15/2014  . 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 TOBACCO USE 05/09/2009  . HEADACHE, CHRONIC 05/09/2009  . REDUCTION MAMMOPLASTY, HX OF 05/09/2009   Past Medical History:  Diagnosis Date  . Chronic Suboxone maintenance 10/15/2014  . 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 Migraine    Past Surgical History:  Procedure Laterality Date  . BREAST SURGERY     reduction in 2010     MEDICATIONS:  (Not in a hospital admission)   ALLERGIES:  No Known Allergies  REVIEW OF SYSTEMS:  Pertinent items noted in HPI and remainder of comprehensive ROS otherwise negative.   FAMILY HISTORY:   Family History  Family history unknown: Yes    SOCIAL HISTORY:   Social History   Tobacco Use  . Smoking status: Current Every Day Smoker    Packs/day: 0.50    Types: Cigarettes  . Smokeless tobacco: Never Used  Substance Use Topics  . Alcohol use: Yes    Alcohol/week: 0.0 standard drinks     EXAMINATION:  Vital signs in last 24 hours: @VSRANGES @  General appearance: alert, cooperative and no distress Neck: no JVD and supple, symmetrical, trachea midline Lungs: clear to auscultation  bilaterally Heart: regular rate and rhythm, S1, S2 normal, no murmur, click, rub or gallop Abdomen: soft, non-tender; bowel sounds normal; no masses,  no organomegaly Extremities: extremities normal, atraumatic, no cyanosis or edema and Homans sign is negative, no sign of DVT Pulses: 2+ and symmetric Skin: Skin color, texture, turgor normal. No rashes or lesions Neurologic: Alert and oriented X 3, normal strength and tone. Normal symmetric reflexes. Normal coordination and gait  Musculoskeletal:  ROM 0-115 Imaging Review Plain radiographs demonstrate mild degenerative joint disease of the right knee. The overall alignment is neutral. The bone quality appears to be good for age and reported activity level.  Assessment/Plan: Right knee medial meniscal tear and ACL tear   The patient history, physical examination and imaging studies are consistent with Right knee medial meniscal tear and ACL tear of the right knee. The patient has failed conservative treatment.  The clearance notes were reviewed.  After discussion with the patient it was felt that Right knee medial menisectomy and ACL reconstruction was indicated. The procedure,  risks, and benefits of total knee arthroplasty were presented and reviewed. The risks including but not limited to aseptic loosening, infection, blood clots, vascular injury, stiffness, patella tracking problems complications among others were discussed. The patient acknowledged the explanation, agreed to proceed with the plan.  11/07/2019, 9:44 AM

## 2019-11-07 NOTE — Patient Instructions (Signed)
Your procedure is scheduled on: Wednesday 11/14/19.  Report to DAY SURGERY DEPARTMENT LOCATED ON 2ND FLOOR MEDICAL MALL ENTRANCE. To find out your arrival time please call [phone] between 1PM - 3PM on Tuesday 11/13/19.   Remember: Instructions that are not followed completely may result in serious medical risk, up to and including death, or upon the discretion of your surgeon and anesthesiologist your surgery may need to be rescheduled.     __X__ 1. Do not eat food after midnight the night before your procedure.                 No gum chewing or hard candies. You may drink clear liquids up to 2 hours                 before you are scheduled to arrive for your surgery- DO NOT drink clear                 liquids within 2 hours of the start of your surgery.                 Clear Liquids include:  water, apple juice without pulp, clear carbohydrate                 drink such as Clearfast or Gatorade, Black Coffee or Tea (Do not add                 milk or creamer to coffee or tea).  __X__2.  On the morning of surgery brush your teeth with toothpaste and water, you may rinse your mouth with mouthwash if you wish.  Do not swallow any toothpaste or mouthwash.    __X__ 3.  No Alcohol for 24 hours before or after surgery.  __X__ 4.  Do Not Smoke or use e-cigarettes For 24 Hours Prior to Your Surgery.                 Do not use any chewable tobacco products for at least 6 hours prior to                 surgery.  __X__5.  Notify your doctor if there is any change in your medical condition      (cold, fever, infections).      Do NOT wear jewelry, make-up, hairpins, clips or nail polish. Do NOT wear lotions, powders, or perfumes.  Do NOT shave 48 hours prior to surgery. Men may shave face and neck. Do NOT bring valuables to the hospital.     [facility] is not responsible for any belongings or valuables.   Contacts, dentures/partials or body piercings may not be worn into surgery. Bring  a case for your contacts, glasses or hearing aids, a denture cup will be supplied.    Patients discharged the day of surgery will not be allowed to drive home.     __X__ Take these medicines the morning of surgery with A SIP OF WATER:     1. oxymetazoline (AFRIN) if needed    __X__ Use CHG Soap as directed  __X__ Stop Anti-inflammatories 7 days before surgery such as Advil, Ibuprofen, Motrin, BC or Goodies Powder, Naprosyn, Naproxen, Aleve, Aspirin, Meloxicam. May take Tylenol if needed for pain or discomfort.   __X__Do not start taking any new herbal supplements or vitamins prior to your procedure.    Wear comfortable clothing (specific to your surgery type) to the hospital.  Plan for stool softeners for home use; pain medications  have a tendency to cause constipation. You can also help prevent constipation by eating foods high in fiber such as fruits and vegetables and drinking plenty of fluids as your diet allows.  After surgery, you can prevent lung complications by doing breathing exercises.Take deep breaths and cough every 1-2 hours. Your doctor may order a device called an Incentive Spirometer to help you take deep breaths.  Please call the Pre-Admissions Testing Department at [phone] if you have any questions about these instructions.

## 2019-11-12 ENCOUNTER — Other Ambulatory Visit: Payer: Self-pay

## 2019-11-12 ENCOUNTER — Other Ambulatory Visit
Admission: RE | Admit: 2019-11-12 | Discharge: 2019-11-12 | Disposition: A | Discharge status: Home / Self Care | Source: Ambulatory Visit | Attending: Orthopedic Surgery | Admitting: Orthopedic Surgery

## 2019-11-12 DIAGNOSIS — Z20822 Contact with and (suspected) exposure to covid-19: Secondary | ICD-10-CM | POA: Diagnosis not present

## 2019-11-12 DIAGNOSIS — Z01812 Encounter for preprocedural laboratory examination: Secondary | ICD-10-CM | POA: Diagnosis present

## 2019-11-12 LAB — SARS CORONAVIRUS 2 (TAT 6-24 HRS): SARS Coronavirus 2: NEGATIVE

## 2019-11-14 ENCOUNTER — Other Ambulatory Visit: Payer: Self-pay

## 2019-11-14 ENCOUNTER — Ambulatory Visit

## 2019-11-14 ENCOUNTER — Encounter: Payer: Self-pay | Admitting: Orthopedic Surgery

## 2019-11-14 ENCOUNTER — Ambulatory Visit: Admitting: Anesthesiology

## 2019-11-14 ENCOUNTER — Ambulatory Visit
Admission: RE | Admit: 2019-11-14 | Discharge: 2019-11-14 | Disposition: A | Discharge status: Home / Self Care | Attending: Orthopedic Surgery | Admitting: Orthopedic Surgery

## 2019-11-14 ENCOUNTER — Encounter
Admission: RE | Disposition: A | Discharge status: Home / Self Care | Payer: Self-pay | Source: Home / Self Care | Attending: Orthopedic Surgery

## 2019-11-14 DIAGNOSIS — S83281A Other tear of lateral meniscus, current injury, right knee, initial encounter: Secondary | ICD-10-CM | POA: Diagnosis not present

## 2019-11-14 DIAGNOSIS — X58XXXA Exposure to other specified factors, initial encounter: Secondary | ICD-10-CM | POA: Diagnosis not present

## 2019-11-14 DIAGNOSIS — G43909 Migraine, unspecified, not intractable, without status migrainosus: Secondary | ICD-10-CM | POA: Insufficient documentation

## 2019-11-14 DIAGNOSIS — F909 Attention-deficit hyperactivity disorder, unspecified type: Secondary | ICD-10-CM | POA: Diagnosis not present

## 2019-11-14 DIAGNOSIS — S83511A Sprain of anterior cruciate ligament of right knee, initial encounter: Secondary | ICD-10-CM | POA: Insufficient documentation

## 2019-11-14 DIAGNOSIS — F329 Major depressive disorder, single episode, unspecified: Secondary | ICD-10-CM | POA: Diagnosis not present

## 2019-11-14 DIAGNOSIS — F17200 Nicotine dependence, unspecified, uncomplicated: Secondary | ICD-10-CM | POA: Insufficient documentation

## 2019-11-14 DIAGNOSIS — Y939 Activity, unspecified: Secondary | ICD-10-CM | POA: Insufficient documentation

## 2019-11-14 DIAGNOSIS — Z79899 Other long term (current) drug therapy: Secondary | ICD-10-CM | POA: Diagnosis not present

## 2019-11-14 DIAGNOSIS — Z419 Encounter for procedure for purposes other than remedying health state, unspecified: Secondary | ICD-10-CM

## 2019-11-14 HISTORY — PX: KNEE ARTHROSCOPY WITH ANTERIOR CRUCIATE LIGAMENT (ACL) REPAIR: SHX5644

## 2019-11-14 LAB — URINE DRUG SCREEN, QUALITATIVE (ARMC ONLY)
Amphetamines, Ur Screen: POSITIVE — AB
Barbiturates, Ur Screen: NOT DETECTED
Benzodiazepine, Ur Scrn: NOT DETECTED
Cannabinoid 50 Ng, Ur ~~LOC~~: NOT DETECTED
Cocaine Metabolite,Ur ~~LOC~~: NOT DETECTED
MDMA (Ecstasy)Ur Screen: NOT DETECTED
Methadone Scn, Ur: NOT DETECTED
Opiate, Ur Screen: NOT DETECTED
Phencyclidine (PCP) Ur S: NOT DETECTED
Tricyclic, Ur Screen: NOT DETECTED

## 2019-11-14 LAB — POCT PREGNANCY, URINE: Preg Test, Ur: NEGATIVE

## 2019-11-14 SURGERY — KNEE ARTHROSCOPY WITH ANTERIOR CRUCIATE LIGAMENT (ACL) REPAIR
Anesthesia: General | Site: Knee | Laterality: Right

## 2019-11-14 MED ORDER — PROPOFOL 10 MG/ML IV BOLUS
INTRAVENOUS | Status: DC | PRN
  Administered 2019-11-14: 180 mg via INTRAVENOUS

## 2019-11-14 MED ORDER — PROPOFOL 10 MG/ML IV BOLUS
INTRAVENOUS | Status: AC
  Filled 2019-11-14: qty 20

## 2019-11-14 MED ORDER — FENTANYL CITRATE (PF) 100 MCG/2ML IJ SOLN
INTRAMUSCULAR | Status: AC
  Filled 2019-11-14: qty 2

## 2019-11-14 MED ORDER — HYDROMORPHONE HCL 1 MG/ML IJ SOLN
INTRAMUSCULAR | Status: AC
  Administered 2019-11-14: 0.25 mg via INTRAVENOUS
  Filled 2019-11-14: qty 1

## 2019-11-14 MED ORDER — BUPIVACAINE HCL (PF) 0.25 % IJ SOLN
INTRAMUSCULAR | Status: DC | PRN
  Administered 2019-11-14: 20 mL

## 2019-11-14 MED ORDER — BUPIVACAINE-EPINEPHRINE (PF) 0.25% -1:200000 IJ SOLN
INTRAMUSCULAR | Status: AC
  Filled 2019-11-14: qty 30

## 2019-11-14 MED ORDER — ONDANSETRON HCL 4 MG/2ML IJ SOLN: 4.0000 mg | Freq: Four times a day (QID) | INTRAMUSCULAR | Status: DC | PRN

## 2019-11-14 MED ORDER — KETOROLAC TROMETHAMINE 15 MG/ML IJ SOLN: 15.0000 mg | Freq: Four times a day (QID) | INTRAMUSCULAR | Status: DC

## 2019-11-14 MED ORDER — OXYCODONE HCL 5 MG PO TABS: 10.0000 mg | ORAL_TABLET | Freq: Once | ORAL | Status: AC

## 2019-11-14 MED ORDER — CHLORHEXIDINE GLUCONATE 0.12 % MT SOLN: 15.0000 mL | Freq: Once | OROMUCOSAL | Status: AC

## 2019-11-14 MED ORDER — CHLORHEXIDINE GLUCONATE 0.12 % MT SOLN
OROMUCOSAL | Status: AC
  Administered 2019-11-14: 15 mL via OROMUCOSAL
  Filled 2019-11-14: qty 15

## 2019-11-14 MED ORDER — HYDROCODONE-ACETAMINOPHEN 5-325 MG PO TABS
1.0000 | ORAL_TABLET | ORAL | 0 refills | Status: AC | PRN
Start: 2019-11-14 — End: 2020-11-13

## 2019-11-14 MED ORDER — OXYCODONE HCL 5 MG PO TABS
ORAL_TABLET | ORAL | Status: AC
  Filled 2019-11-14: qty 1

## 2019-11-14 MED ORDER — OXYCODONE HCL 5 MG/5ML PO SOLN: 5.0000 mg | Freq: Once | ORAL | Status: DC | PRN

## 2019-11-14 MED ORDER — HYDROMORPHONE HCL 1 MG/ML IJ SOLN: 0.2500 mg | INTRAMUSCULAR | Status: DC | PRN

## 2019-11-14 MED ORDER — ONDANSETRON HCL 4 MG/2ML IJ SOLN: 4.0000 mg | Freq: Once | INTRAMUSCULAR | Status: DC | PRN

## 2019-11-14 MED ORDER — LIDOCAINE HCL (CARDIAC) PF 100 MG/5ML IV SOSY
PREFILLED_SYRINGE | INTRAVENOUS | Status: DC | PRN
  Administered 2019-11-14: 80 mg via INTRAVENOUS

## 2019-11-14 MED ORDER — HYDROMORPHONE HCL 1 MG/ML IJ SOLN
INTRAMUSCULAR | Status: AC
  Administered 2019-11-14: 0.5 mg via INTRAVENOUS
  Filled 2019-11-14: qty 1

## 2019-11-14 MED ORDER — LACTATED RINGERS IV SOLN: INTRAVENOUS | Status: DC

## 2019-11-14 MED ORDER — GLYCOPYRROLATE 0.2 MG/ML IJ SOLN
INTRAMUSCULAR | Status: DC | PRN
  Administered 2019-11-14: .2 mg via INTRAVENOUS

## 2019-11-14 MED ORDER — MORPHINE SULFATE (PF) 2 MG/ML IV SOLN: 0.5000 mg | INTRAVENOUS | Status: DC | PRN

## 2019-11-14 MED ORDER — OXYCODONE HCL 5 MG PO TABS
ORAL_TABLET | ORAL | Status: AC
  Administered 2019-11-14: 10 mg via ORAL
  Filled 2019-11-14: qty 1

## 2019-11-14 MED ORDER — ORAL CARE MOUTH RINSE: 15.0000 mL | Freq: Once | OROMUCOSAL | Status: AC

## 2019-11-14 MED ORDER — ONDANSETRON HCL 4 MG/2ML IJ SOLN
INTRAMUSCULAR | Status: DC | PRN
  Administered 2019-11-14: 4 mg via INTRAVENOUS

## 2019-11-14 MED ORDER — PHENYLEPHRINE HCL (PRESSORS) 10 MG/ML IV SOLN
INTRAVENOUS | Status: DC | PRN
  Administered 2019-11-14: 100 ug via INTRAVENOUS

## 2019-11-14 MED ORDER — DOCUSATE SODIUM 100 MG PO CAPS: 100.0000 mg | ORAL_CAPSULE | Freq: Every day | ORAL | 2 refills | Status: AC | PRN

## 2019-11-14 MED ORDER — METOCLOPRAMIDE HCL 10 MG PO TABS: 5.0000 mg | ORAL_TABLET | Freq: Three times a day (TID) | ORAL | Status: DC | PRN

## 2019-11-14 MED ORDER — BUPIVACAINE HCL (PF) 0.25 % IJ SOLN: INTRAMUSCULAR | Status: DC | PRN

## 2019-11-14 MED ORDER — DEXAMETHASONE SODIUM PHOSPHATE 10 MG/ML IJ SOLN
INTRAMUSCULAR | Status: DC | PRN
  Administered 2019-11-14: 4 mg

## 2019-11-14 MED ORDER — MIDAZOLAM HCL 2 MG/2ML IJ SOLN
INTRAMUSCULAR | Status: AC
  Administered 2019-11-14: 1 mg via INTRAVENOUS
  Filled 2019-11-14: qty 2

## 2019-11-14 MED ORDER — FENTANYL CITRATE (PF) 100 MCG/2ML IJ SOLN
INTRAMUSCULAR | Status: DC | PRN
Start: 2019-11-14 — End: 2019-11-14
  Administered 2019-11-14: 50 ug via INTRAVENOUS
  Administered 2019-11-14 (×2): 25 ug via INTRAVENOUS

## 2019-11-14 MED ORDER — BUPIVACAINE HCL (PF) 0.5 % IJ SOLN
INTRAMUSCULAR | Status: AC
  Filled 2019-11-14: qty 10

## 2019-11-14 MED ORDER — KETAMINE HCL 50 MG/ML IJ SOLN
INTRAMUSCULAR | Status: AC
  Filled 2019-11-14: qty 10

## 2019-11-14 MED ORDER — DEXAMETHASONE SODIUM PHOSPHATE 10 MG/ML IJ SOLN
INTRAMUSCULAR | Status: AC
  Filled 2019-11-14: qty 1

## 2019-11-14 MED ORDER — FENTANYL CITRATE (PF) 100 MCG/2ML IJ SOLN
INTRAMUSCULAR | Status: AC
  Administered 2019-11-14: 50 ug via INTRAVENOUS
  Filled 2019-11-14: qty 2

## 2019-11-14 MED ORDER — ACETAMINOPHEN 325 MG PO TABS: 325.0000 mg | ORAL_TABLET | Freq: Four times a day (QID) | ORAL | Status: DC | PRN

## 2019-11-14 MED ORDER — FENTANYL CITRATE (PF) 100 MCG/2ML IJ SOLN: 50.0000 ug | Freq: Once | INTRAMUSCULAR | Status: AC

## 2019-11-14 MED ORDER — KETOROLAC TROMETHAMINE 15 MG/ML IJ SOLN
INTRAMUSCULAR | Status: AC
  Administered 2019-11-14: 15 mg via INTRAVENOUS
  Filled 2019-11-14: qty 1

## 2019-11-14 MED ORDER — HYDROCODONE-ACETAMINOPHEN 7.5-325 MG PO TABS: 1.0000 | ORAL_TABLET | ORAL | Status: DC | PRN

## 2019-11-14 MED ORDER — MIDAZOLAM HCL 2 MG/2ML IJ SOLN
1.0000 mg | Freq: Once | INTRAMUSCULAR | Status: AC
  Administered 2019-11-14: 1 mg via INTRAVENOUS

## 2019-11-14 MED ORDER — OXYCODONE HCL 5 MG PO TABS: 5.0000 mg | ORAL_TABLET | Freq: Once | ORAL | Status: DC | PRN

## 2019-11-14 MED ORDER — HYDROCODONE-ACETAMINOPHEN 5-325 MG PO TABS: 1.0000 | ORAL_TABLET | ORAL | Status: DC | PRN

## 2019-11-14 MED ORDER — BUPIVACAINE-EPINEPHRINE (PF) 0.25% -1:200000 IJ SOLN
INTRAMUSCULAR | Status: DC | PRN
  Administered 2019-11-14: 17 mL via PERINEURAL

## 2019-11-14 MED ORDER — DEXMEDETOMIDINE HCL IN NACL 200 MCG/50ML IV SOLN
INTRAVENOUS | Status: DC | PRN
  Administered 2019-11-14: 20 ug via INTRAVENOUS

## 2019-11-14 MED ORDER — ACETAMINOPHEN 500 MG PO TABS: 500.0000 mg | ORAL_TABLET | Freq: Four times a day (QID) | ORAL | Status: DC

## 2019-11-14 MED ORDER — ACETAMINOPHEN 10 MG/ML IV SOLN: 1000.0000 mg | Freq: Once | INTRAVENOUS | Status: DC | PRN

## 2019-11-14 MED ORDER — MIDAZOLAM HCL 2 MG/2ML IJ SOLN
INTRAMUSCULAR | Status: AC
  Filled 2019-11-14: qty 2

## 2019-11-14 MED ORDER — HYDROMORPHONE HCL 1 MG/ML IJ SOLN
INTRAMUSCULAR | Status: DC | PRN
  Administered 2019-11-14: 1 mg via INTRAVENOUS

## 2019-11-14 MED ORDER — GLYCOPYRROLATE 0.2 MG/ML IJ SOLN
INTRAMUSCULAR | Status: AC
  Filled 2019-11-14: qty 1

## 2019-11-14 MED ORDER — ROPIVACAINE HCL 5 MG/ML IJ SOLN
INTRAMUSCULAR | Status: AC
  Filled 2019-11-14: qty 20

## 2019-11-14 MED ORDER — FENTANYL CITRATE (PF) 100 MCG/2ML IJ SOLN
25.0000 ug | INTRAMUSCULAR | Status: DC | PRN
  Administered 2019-11-14: 50 ug via INTRAVENOUS

## 2019-11-14 MED ORDER — HYDROMORPHONE HCL 1 MG/ML IJ SOLN
0.5000 mg | INTRAMUSCULAR | Status: AC | PRN
  Administered 2019-11-14 (×2): 0.5 mg via INTRAVENOUS

## 2019-11-14 MED ORDER — LACTATED RINGERS IV SOLN
INTRAVENOUS | Status: DC | PRN
  Administered 2019-11-14: 1 mL

## 2019-11-14 MED ORDER — HYDROMORPHONE HCL 1 MG/ML IJ SOLN
INTRAMUSCULAR | Status: AC
  Filled 2019-11-14: qty 1

## 2019-11-14 MED ORDER — KETOROLAC TROMETHAMINE 15 MG/ML IJ SOLN
INTRAMUSCULAR | Status: AC
  Filled 2019-11-14: qty 1

## 2019-11-14 MED ORDER — ONDANSETRON HCL 4 MG PO TABS: 4.0000 mg | ORAL_TABLET | Freq: Four times a day (QID) | ORAL | Status: DC | PRN

## 2019-11-14 MED ORDER — FAMOTIDINE 20 MG PO TABS: 20.0000 mg | ORAL_TABLET | Freq: Once | ORAL | Status: AC

## 2019-11-14 MED ORDER — FAMOTIDINE 20 MG PO TABS
ORAL_TABLET | ORAL | Status: AC
  Administered 2019-11-14: 20 mg via ORAL
  Filled 2019-11-14: qty 1

## 2019-11-14 MED ORDER — EPINEPHRINE PF 1 MG/ML IJ SOLN
INTRAMUSCULAR | Status: AC
  Filled 2019-11-14: qty 1

## 2019-11-14 MED ORDER — SODIUM CHLORIDE (PF) 0.9 % IJ SOLN
INTRAMUSCULAR | Status: AC
  Filled 2019-11-14: qty 10

## 2019-11-14 MED ORDER — BACITRACIN 50000 UNITS IM SOLR
INTRAMUSCULAR | Status: AC
  Filled 2019-11-14: qty 1

## 2019-11-14 MED ORDER — CEFAZOLIN SODIUM-DEXTROSE 2-4 GM/100ML-% IV SOLN
INTRAVENOUS | Status: AC
  Filled 2019-11-14: qty 100

## 2019-11-14 MED ORDER — METOCLOPRAMIDE HCL 5 MG/ML IJ SOLN: 5.0000 mg | Freq: Three times a day (TID) | INTRAMUSCULAR | Status: DC | PRN

## 2019-11-14 MED ORDER — CEFAZOLIN SODIUM-DEXTROSE 2-4 GM/100ML-% IV SOLN
2.0000 g | INTRAVENOUS | Status: AC
  Administered 2019-11-14: 2 g via INTRAVENOUS

## 2019-11-14 MED ORDER — KETAMINE HCL 50 MG/ML IJ SOLN
INTRAMUSCULAR | Status: DC | PRN
  Administered 2019-11-14: 50 mg via INTRAMUSCULAR

## 2019-11-14 MED ORDER — SEVOFLURANE IN SOLN
RESPIRATORY_TRACT | Status: AC
  Filled 2019-11-14: qty 250

## 2019-11-14 SURGICAL SUPPLY — 92 items
"PENCIL ELECTRO HAND CTR " (MISCELLANEOUS) ×1 IMPLANT
ADAPTER IRRIG TUBE 2 SPIKE SOL (ADAPTER) ×4 IMPLANT
ADPR TBG 2 SPK PMP STRL ASCP (ADAPTER) ×2
APL PRP STRL LF DISP 70% ISPRP (MISCELLANEOUS) ×1
BIT DRILL CANN SENTINAL 9 (BIT) ×1 IMPLANT
BIT DRILL SPADE TIP 3.2 (BIT) ×1 IMPLANT
BLADE FULL RADIUS 3.5 (BLADE) ×2 IMPLANT
BLADE INCISOR PLUS 4.5 (BLADE) ×2 IMPLANT
BLADE SHAVER 4.5 DBL SERAT CV (CUTTER) ×2 IMPLANT
BLADE SURG 15 STRL LF DISP TIS (BLADE) ×1 IMPLANT
BLADE SURG 15 STRL SS (BLADE) ×2
BLADE SURG SZ10 CARB STEEL (BLADE) ×2 IMPLANT
BLADE SURG SZ11 CARB STEEL (BLADE) ×2 IMPLANT
BNDG ELASTIC 6X5.8 VLCR STR LF (GAUZE/BANDAGES/DRESSINGS) ×2 IMPLANT
BNDG ESMARK 6X12 TAN STRL LF (GAUZE/BANDAGES/DRESSINGS) ×2 IMPLANT
BRACE KNEE POST OP SHORT (BRACE) ×2 IMPLANT
BRUSH SCRUB EZ  4% CHG (MISCELLANEOUS) ×2
BRUSH SCRUB EZ 4% CHG (MISCELLANEOUS) ×1 IMPLANT
BUTTON LOOP FEMORAL INFINITY (Button) ×1 IMPLANT
CANNULA SHOE HORN (MISCELLANEOUS) ×2 IMPLANT
CHLORAPREP W/TINT 26 (MISCELLANEOUS) ×2 IMPLANT
CLEANER CAUTERY TIP 5X5 PAD (MISCELLANEOUS) ×1 IMPLANT
COOLER POLAR GLACIER W/PUMP (MISCELLANEOUS) ×2 IMPLANT
COVER BACK TABLE REUSABLE LG (DRAPES) ×2 IMPLANT
COVER MAYO STAND REUSABLE (DRAPES) ×2 IMPLANT
COVER WAND RF STERILE (DRAPES) ×2 IMPLANT
DRAPE 3/4 80X56 (DRAPES) ×4 IMPLANT
DRAPE POUCH INSTRU U-SHP 10X18 (DRAPES) ×2 IMPLANT
DRAPE U-SHAPE 47X51 STRL (DRAPES) ×2 IMPLANT
ELECT REM PT RETURN 9FT ADLT (ELECTROSURGICAL) ×2
ELECTRODE REM PT RTRN 9FT ADLT (ELECTROSURGICAL) ×1 IMPLANT
FASTFIX NDL DEL SYS 360 CVD (Miscellaneous) ×2 IMPLANT
GAUZE 4X4 16PLY RFD (DISPOSABLE) ×2 IMPLANT
GAUZE SPONGE 4X4 12PLY STRL (GAUZE/BANDAGES/DRESSINGS) ×2 IMPLANT
GAUZE XEROFORM 1X8 LF (GAUZE/BANDAGES/DRESSINGS) ×2 IMPLANT
GLOVE BIOGEL PI IND STRL 8.5 (GLOVE) ×1 IMPLANT
GLOVE BIOGEL PI INDICATOR 8.5 (GLOVE) ×1
GLOVE INDICATOR 8.0 STRL GRN (GLOVE) ×2 IMPLANT
GLOVE SURG ORTHO 8.0 STRL STRW (GLOVE) ×2 IMPLANT
GOWN STRL REUS W/ TWL LRG LVL3 (GOWN DISPOSABLE) ×2 IMPLANT
GOWN STRL REUS W/ TWL XL LVL3 (GOWN DISPOSABLE) ×1 IMPLANT
GOWN STRL REUS W/TWL LRG LVL3 (GOWN DISPOSABLE) ×4
GOWN STRL REUS W/TWL XL LVL3 (GOWN DISPOSABLE) ×2
GRADUATE 1200CC STRL 31836 (MISCELLANEOUS) ×2 IMPLANT
GUIDEWIRE 1.2MMX18 (WIRE) ×2 IMPLANT
HANDLE YANKAUER SUCT BULB TIP (MISCELLANEOUS) ×2 IMPLANT
IV LACTATED RINGER IRRG 3000ML (IV SOLUTION) ×12
IV LR IRRIG 3000ML ARTHROMATIC (IV SOLUTION) ×6 IMPLANT
KIT TURNOVER KIT A (KITS) ×2 IMPLANT
LABEL OR SOLS (LABEL) ×2 IMPLANT
MANIFOLD NEPTUNE II (INSTRUMENTS) ×2 IMPLANT
MAT ABSORB  FLUID 56X50 GRAY (MISCELLANEOUS) ×2
MAT ABSORB FLUID 56X50 GRAY (MISCELLANEOUS) ×1 IMPLANT
NDL SAFETY ECLIPSE 18X1.5 (NEEDLE) ×1 IMPLANT
NDL SPNL 20GX3.5 QUINCKE YW (NEEDLE) ×1 IMPLANT
NEEDLE HYPO 18GX1.5 SHARP (NEEDLE) ×2
NEEDLE SPNL 20GX3.5 QUINCKE YW (NEEDLE) ×2 IMPLANT
PACK KNEE ARTHRO (MISCELLANEOUS) ×2 IMPLANT
PAD ABD DERMACEA PRESS 5X9 (GAUZE/BANDAGES/DRESSINGS) ×4 IMPLANT
PAD CLEANER CAUTERY TIP 5X5 (MISCELLANEOUS) ×1
PAD WRAPON POLAR KNEE (MISCELLANEOUS) ×1 IMPLANT
PENCIL ELECTRO HAND CTR (MISCELLANEOUS) ×2 IMPLANT
PUSHER KNOT ARTHRO 360DEG (MISCELLANEOUS) ×1 IMPLANT
SCREW CANN GENESYS 8X30 (Screw) ×1 IMPLANT
STAPLE FIXATION W/SPIKE  SMALL (Staple) ×2 IMPLANT
STAPLE FIXATION W/SPIKE SMALL (Staple) IMPLANT
STAPLER SKIN PROX 35W (STAPLE) ×2 IMPLANT
SUCTION FRAZIER HANDLE 10FR (MISCELLANEOUS) ×2
SUCTION TUBE FRAZIER 10FR DISP (MISCELLANEOUS) ×1 IMPLANT
SUT 2 FIBERLOOP 20 STRT BLUE (SUTURE) ×8
SUT ETHILON 4-0 (SUTURE) ×2
SUT ETHILON 4-0 FS2 18XMFL BLK (SUTURE) ×1
SUT ETHILON NAB PS2 4-0 18IN (SUTURE) ×2 IMPLANT
SUT FIBERSNARE 2 CLSD LOOP (SUTURE) ×2 IMPLANT
SUT FIBERWIRE #2 38 T-5 BLUE (SUTURE) ×2
SUT LOOP PASSING HIFI (SUTURE) ×1 IMPLANT
SUT VIC AB 0 CT1 36 (SUTURE) ×2 IMPLANT
SUT VIC AB 1 CT1 36 (SUTURE) IMPLANT
SUT VIC AB 2-0 SH 27 (SUTURE) ×2
SUT VIC AB 2-0 SH 27XBRD (SUTURE) ×1 IMPLANT
SUT VIC AB 3-0 PS2 18 (SUTURE) IMPLANT
SUTURE 2 FIBERLOOP 20 STRT BLU (SUTURE) ×2 IMPLANT
SUTURE ETHLN 4-0 FS2 18XMF BLK (SUTURE) ×1 IMPLANT
SUTURE FIBERWR #2 38 T-5 BLUE (SUTURE) ×1 IMPLANT
SYR 10ML LL (SYRINGE) ×4 IMPLANT
SYR BULB IRRIG 60ML STRL (SYRINGE) ×2 IMPLANT
SYSTEM NDL DEL FSTFX  360 CVD (Miscellaneous) IMPLANT
TOWEL OR 17X26 4PK STRL BLUE (TOWEL DISPOSABLE) ×6 IMPLANT
TUBING ARTHRO INFLOW-ONLY STRL (TUBING) ×2 IMPLANT
TUBING CONNECTING 10 (TUBING) ×2 IMPLANT
WAND WEREWOLF FLOW 90D (MISCELLANEOUS) ×2 IMPLANT
WRAPON POLAR PAD KNEE (MISCELLANEOUS) ×2

## 2019-11-14 NOTE — Anesthesia Preprocedure Evaluation (Addendum)
Anesthesia Evaluation  Patient identified by MRN, date of birth, ID band Patient awake    Reviewed: Allergy & Precautions, NPO status , Patient's Chart, lab work & pertinent test results  History of Anesthesia Complications Negative for: history of anesthetic complications  Airway Mallampati: II  TM Distance: >3 FB Neck ROM: Full    Dental no notable dental hx. (+) Teeth Intact, Dental Advisory Given   Pulmonary neg sleep apnea, neg COPD, Current Smoker and Patient abstained from smoking., former smoker,  Mostly vapes   Pulmonary exam normal breath sounds clear to auscultation       Cardiovascular Exercise Tolerance: Good METS(-) hypertension(-) CAD and (-) Past MI negative cardio ROS  (-) dysrhythmias  Rhythm:Regular Rate:Normal - Systolic murmurs    Neuro/Psych  Headaches, PSYCHIATRIC DISORDERS Depression    GI/Hepatic neg GERD  ,(+)     (-) substance abuse  ,   Endo/Other  neg diabetes  Renal/GU negative Renal ROS     Musculoskeletal   Abdominal   Peds  Hematology   Anesthesia Other Findings Past Medical History: 10/15/2014: Chronic Suboxone maintenance 10/15/2014: Heroin addiction, in recovery No date: Migraine  Reproductive/Obstetrics                            Anesthesia Physical Anesthesia Plan  ASA: II  Anesthesia Plan: General   Post-op Pain Management:  Regional for Post-op pain   Induction: Intravenous  PONV Risk Score and Plan: 2 and Ondansetron, Dexamethasone and Midazolam  Airway Management Planned: LMA  Additional Equipment: None  Intra-op Plan:   Post-operative Plan: Extubation in OR  Informed Consent: I have reviewed the patients History and Physical, chart, labs and discussed the procedure including the risks, benefits and alternatives for the proposed anesthesia with the patient or authorized representative who has indicated his/her understanding and  acceptance.     Dental advisory given  Plan Discussed with: CRNA and Surgeon  Anesthesia Plan Comments: (Discussed risks of anesthesia with patient, including PONV, sore throat, lip/dental damage. Rare risks discussed as well, such as cardiorespiratory and neurological sequelae. Patient understands. Offered her a spinal anesthetic in light of her chronic buprenorphine use for addiction, and possible increased pain perception. Patient ambivalent, had a prior poor experience with her cesarean, so we will elect to proceed with general anesthesia.  Discussed r/b/a of adductor canal nerve block, including:  - bleeding, infection, nerve damage - poor or non functioning block. Patient understands. )       Anesthesia Quick Evaluation

## 2019-11-14 NOTE — H&P (Signed)
The patient has been re-examined, and the chart reviewed, and there have been no interval changes to the documented history and physical.  Plan a arthroscopic right knee ACL reconstruction today.  Anesthesia is consulted regarding a peripheral nerve block for post-operative pain.  The risks, benefits, and alternatives have been discussed at length, and the patient is willing to proceed.

## 2019-11-14 NOTE — Transfer of Care (Signed)
Immediate Anesthesia Transfer of Care Note  Patient: Darlene Baker  Procedure(s) Performed: Right knee ACL Reconstruction with Allograft, Partial Medial Meniscectomy, Partial Synovectomy (Right Knee)  Patient Location: PACU  Anesthesia Type:General  Level of Consciousness: awake and alert   Airway & Oxygen Therapy: Patient Spontanous Breathing and Patient connected to face mask oxygen  Post-op Assessment: Report given to RN and Post -op Vital signs reviewed and stable  Post vital signs: Reviewed and stable  Last Vitals:  Vitals Value Taken Time  BP 98/60 11/14/19 1341  Temp 36.2 C 11/14/19 1341  Pulse 69 11/14/19 1347  Resp 13 11/14/19 1347  SpO2 100 % 11/14/19 1347  Vitals shown include unvalidated device data.  Last Pain:  Vitals:   11/14/19 1341  TempSrc:   PainSc: (P) Asleep         Complications: No complications documented.

## 2019-11-14 NOTE — Anesthesia Procedure Notes (Signed)
Procedure Name: LMA Insertion Performed by: Dhani Imel Ben, CRNA Pre-anesthesia Checklist: Patient identified, Emergency Drugs available, Suction available and Patient being monitored Patient Re-evaluated:Patient Re-evaluated prior to induction Oxygen Delivery Method: Circle system utilized Preoxygenation: Pre-oxygenation with 100% oxygen Induction Type: IV induction Ventilation: Mask ventilation without difficulty LMA: LMA inserted LMA Size: 4.0 Tube type: Oral Number of attempts: 1 Airway Equipment and Method: Oral airway Placement Confirmation: positive ETCO2 and breath sounds checked- equal and bilateral Tube secured with: Tape Dental Injury: Teeth and Oropharynx as per pre-operative assessment        

## 2019-11-14 NOTE — Op Note (Signed)
11/14/2019  1:38 PM  Patient:   Darlene Baker  Pre-Op Diagnosis:   Anterior cruciate ligament tear, right knee.  Post-Op Diagnosis:   Same and lateral meniscal tear  Procedure:   Arthroscopically assisted anterior cruciate ligament reconstruction using hamstring autograft, lateral meniscal repair, right knee  Surgeon:   Cassell Smiles, MD  Assistant:  Altamese Cabal, PA-C  Anesthesia:   General laryngeal mask anesthesia with a femoral nerve block placed preoperatively by the anesthesiologist.  Findings:   As above.  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 The patient presents at this time for arthroscopy, meniscal repair and reconstruction of the anterior cruciate ligament using hamstring autograft.  Procedure:   The patient underwent placement of a femoral nerve block in the preoperative holding area being brought into the operating room and lain in the supine position. After adequate general laryngal mask anesthesia was obtained, a timeout was performed to verify the appropriate surgical site and side. An examination under anesthesia demonstrated a 2+ Lachman and a 1+ pivot shift. The right lower extremity was prepped with ChloraPrep solution before being draped sterilely. Preoperative antibiotics were administered. The limb was exsanguinated with an Esmarch and the tourniquet inflated to 250 mmHg. A total of 20 cc of 0.5% Sensorcaine with epinephrine was injected into the expected incision sites and portal sites. An incision was made over the anterior medial aspect of the knee over the pes anserinus. The incision was carried down through the subcutaneous cutaneous tissues to expose the superficial  retinaculum. This was split the length of the incision and the medial and lateral flaps elevated sufficiently to expose the semitendinosis and gracilis tendon. Using a tendon stripper the semitendinosis and gracilis was harvested and passed from the field. Graft preparation was performed on the back table and the doubled graft was kept on tension.  The arthroscopic portion of the procedure was begun. Subcutaneous anteromedial and anterolateral portal was created before the camera was placed in the anterolateral portal and instrumentation performed through the anteromedial portal. The knee was sequentially examined beginning in the suprapatellar pouch, then progressing to the patellofemoral space, the medial gutter compartment, the notch, and finally the lateral compartment and gutter. Abundant reactive synovial tissues anteriorly were debrided in order to improve visualization. The findings were as described above. The posterior horn of the lateral meniscus was detached from the capsule and there was complete disruption of the ACL from the femoral attachment.  Using the Fast-Fix system a horizontal stitch was placed at the posterior horn of the lateral meniscus with excellent fixation. The meniscus was stable upon completion.  Attention was redirected to the notch. The remnants of the anterior cruciate ligament were debrided from the femoral and tibial sides. The epiphyseal femoral guide was introduced and a 9 mm diameter by 30 mm length tunnel was created with the low profile reamer. The tibial guide was positioned and the 8 mm drill was drilled up through the proximal tibia into the notch. After verifying its position, a 8 mm diameter by 40 mm depth tunnel was created. Care was taken to protect the femoral and tibial physis at all times. The  bony debris was removed using the full-radius resector. The graft was passed using a fiber loop and the femoral side was tightened.   The distal portion was then  passed through the tibial tunnel and the knee placed in 15 degrees of flexion with a posterior drawer. The tibial fixation consisted of a interference screw with staple backup.   Once the graft was secured, a gentle Lachman maneuver demonstrated excellent stability with less than 2 mm of anterior displacement. A gentle pivot shift was negative.  The scope was reintroduced to be sure that there was no impingement in extension. The superficial retinacular layer was reapproximated using 2-0 Vicryl interrupted sutures. The subcutaneous tissues were closed in two layers using 2-0 Vicryl interrupted sutures before the skin was closed using staples. Portal sites were closed with 4-0 nylon. A sterile bulky dressing was applied to the knee, incorporating a Polar Care pad, before the patient was placed into a hinged knee brace with the hinges set at 0-90 but locked in extension. The patient was then awakened, extubated, and returned to the recovery room in satisfactory condition after tolerating the procedure well.

## 2019-11-14 NOTE — Anesthesia Postprocedure Evaluation (Signed)
Anesthesia Post Note  Patient: Darlene Baker  Procedure(s) Performed: Right knee ACL Reconstruction with Allograft, Partial Medial Meniscectomy, Partial Synovectomy (Right Knee)  Patient location during evaluation: PACU Anesthesia Type: General Level of consciousness: awake and alert Pain management: pain level controlled Vital Signs Assessment: post-procedure vital signs reviewed and stable Respiratory status: spontaneous breathing, nonlabored ventilation, respiratory function stable and patient connected to nasal cannula oxygen Cardiovascular status: blood pressure returned to baseline and stable Postop Assessment: no apparent nausea or vomiting Anesthetic complications: no   No complications documented.   Last Vitals:  Vitals:   11/14/19 1456 11/14/19 1511  BP: 121/75 110/66  Pulse: 86 64  Resp: 16 15  Temp:    SpO2: 98% 97%    Last Pain:  Vitals:   11/14/19 1514  TempSrc:   PainSc: 6                  Corinda Gubler

## 2019-11-14 NOTE — Anesthesia Procedure Notes (Signed)
Anesthesia Regional Block: Adductor canal block   Pre-Anesthetic Checklist: ,, timeout performed, Correct Patient, Correct Site, Correct Laterality, Correct Procedure, Correct Position, site marked, Risks and benefits discussed,  Surgical consent,  Pre-op evaluation,  At surgeon's request and post-op pain management  Laterality: Right and Lower  Prep: chloraprep       Needles:  Injection technique: Single-shot  Needle Type: Echogenic Needle     Needle Length: 9cm  Needle Gauge: 21     Additional Needles:   Procedures:,,,, ultrasound used (permanent image in chart),,,,  Narrative:  Start time: 11/14/2019 9:53 AM End time: 11/14/2019 9:56 AM Injection made incrementally with aspirations every 5 mL.  Performed by: Personally  Anesthesiologist: Corinda Gubler, MD  Additional Notes: Patient consented for risk and benefits of nerve block including but not limited to nerve damage, failed block, bleeding and infection.  Patient voiced understanding.  Functioning IV was confirmed and monitors were applied.  A echogenic needle was used. Sterile prep,hand hygiene and sterile gloves were used. Minimal sedation used for procedure.   No paresthesia endorsed by patient during the procedure.  Negative aspiration and negative test dose prior to incremental administration of local anesthetic. The patient tolerated the procedure well with no immediate complications. 20 ml 0.25% bupi + 4mg  decadron injected.

## 2019-11-14 NOTE — Discharge Instructions (Signed)
AMBULATORY SURGERY  DISCHARGE INSTRUCTIONS   1) The drugs that you were given will stay in your system until tomorrow so for the next 24 hours you should not:  A) Drive an automobile B) Make any legal decisions C) Drink any alcoholic beverage   2) You may resume regular meals tomorrow.  Today it is better to start with liquids and gradually work up to solid foods.  You may eat anything you prefer, but it is better to start with liquids, then soup and crackers, and gradually work up to solid foods.   3) Please notify your doctor immediately if you have any unusual bleeding, trouble breathing, redness and pain at the surgery site, drainage, fever, or pain not relieved by medication. 4)   5) Your post-operative visit with Dr.                                     is: Date:                        Time:    Please call to schedule your post-operative visit.  6) Additional Instructions:     Post Op Home Instructions for Knee Arthroscopy  1) You may remove the Ace wrap and dressings two days after surgery.  Place band aids over the incision sites.  2) You may shower after you remove the surgical dressing.  You do not need to cover the incision with plastic wrap.  The incision can get wet, but do not submerge under water.   3) Pain medication can cause constipation.  You should increase your fluid intake, increase your intake of high fiber foods and/or take Metamucil as needed for constipation.  4) Continue your physical therapy exercises, as shown at the office, at least twice daily.  You should set up outpatient physical therapy and start within the first week after surgery.  5) Wear the knee immobilizer whenever walking or standing  8) Continue to use your Polar Pack continuously for 2-3 days after surgery.  After you remove the surgical dressing, it is a good idea to use your Polar Pack or ice pack for 30 minutes after doing your exercises to reduce swelling.  9) Do not be  surprised if you have increased pain at night.  This usually means you have been a little too active during the day and need to reduce your activities.  10) If you develop lower extremity swelling that does not improve after a night of elevation, please call the office.  This could be an early sign of a blood clot.  Please call with any questions at [phone]

## 2019-11-15 ENCOUNTER — Encounter: Payer: Self-pay | Admitting: Orthopedic Surgery

## 2019-11-20 ENCOUNTER — Encounter: Payer: Self-pay | Admitting: Orthopedic Surgery

## 2020-03-06 ENCOUNTER — Ambulatory Visit (HOSPITAL_COMMUNITY)
Admission: EM | Admit: 2020-03-06 | Discharge: 2020-03-06 | Disposition: A | Discharge status: Home / Self Care | Attending: Internal Medicine | Admitting: Internal Medicine

## 2020-03-06 ENCOUNTER — Other Ambulatory Visit: Payer: Self-pay

## 2020-03-06 ENCOUNTER — Encounter (HOSPITAL_COMMUNITY): Payer: Self-pay

## 2020-03-06 DIAGNOSIS — R509 Fever, unspecified: Secondary | ICD-10-CM | POA: Diagnosis not present

## 2020-03-06 DIAGNOSIS — Z3202 Encounter for pregnancy test, result negative: Secondary | ICD-10-CM | POA: Diagnosis not present

## 2020-03-06 DIAGNOSIS — R350 Frequency of micturition: Secondary | ICD-10-CM

## 2020-03-06 DIAGNOSIS — N3001 Acute cystitis with hematuria: Secondary | ICD-10-CM

## 2020-03-06 DIAGNOSIS — R109 Unspecified abdominal pain: Secondary | ICD-10-CM

## 2020-03-06 DIAGNOSIS — R300 Dysuria: Secondary | ICD-10-CM | POA: Diagnosis not present

## 2020-03-06 LAB — POC URINE PREG, ED: Preg Test, Ur: NEGATIVE

## 2020-03-06 LAB — POCT URINALYSIS DIPSTICK, ED / UC
Bilirubin Urine: NEGATIVE
Glucose, UA: NEGATIVE mg/dL
Ketones, ur: NEGATIVE mg/dL
Nitrite: NEGATIVE
Protein, ur: NEGATIVE mg/dL
Specific Gravity, Urine: >=1.03 (ref 1.005–1.030)
Urobilinogen, UA: 1 mg/dL (ref 0.0–1.0)
pH: 5.5 (ref 5.0–8.0)

## 2020-03-06 MED ORDER — NITROFURANTOIN MONOHYD MACRO 100 MG PO CAPS: 100.0000 mg | ORAL_CAPSULE | Freq: Two times a day (BID) | ORAL | 0 refills | Status: AC

## 2020-03-06 NOTE — ED Provider Notes (Signed)
[facility]    CSN: [id number] Arrival date & time: 03/06/20  1742      History   Chief Complaint Chief Complaint  Patient presents with  . Abdominal Pain  . vaginal burning/ odor  . Fever  . Urinary Frequency    HPI Darlene Baker is a 34 y.o. female presenting for abd pain, dysuria, fevers (not currently), and frequency for 1 month. History of UTIs in the past. Today endorses 1 month of right sided abd pain, dyrusia, foul smell urine, frequency, and temperature few days ago as nigh as 103. abd pain seems to be intermittently getting worse. Denies hematuria,urgency, back pain, n/v/d/abd pain, abdnormal vaginal discharge. No new partners, denies STI risk. Not pregnant. She does still have her gallbladder.   HPI  Past Medical History:  Diagnosis Date  . Chronic Suboxone maintenance 10/15/2014  . 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 Migraine     Patient Active Problem List   Diagnosis Date Noted  . Right anterior knee pain 11/19/2014  . Epigastric abdominal pain 11/19/2014  . 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 Chronic Suboxone maintenance 10/15/2014  . 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 TOBACCO USE 05/09/2009  . HEADACHE, CHRONIC 05/09/2009  . REDUCTION MAMMOPLASTY, HX OF 05/09/2009    Past Surgical History:  Procedure Laterality Date  . BREAST SURGERY     reduction in 2010  . KNEE ARTHROSCOPY WITH ANTERIOR CRUCIATE LIGAMENT (ACL) REPAIR Right 11/14/2019   Procedure: Right knee ACL Reconstruction with Allograft, Partial Medial Meniscectomy, Partial Synovectomy;  Surgeon: Lyndle Herrlich, MD;  Location: [facility];  Service: Orthopedics;  Laterality: Right;    OB History    Gravida  1   Para  0   Term  0   Preterm  0   AB  0   Living  0     SAB  0   IAB  0   Ectopic  0   Multiple  0   Live Births               Home Medications    Prior to Admission medications    Medication Sig Start Date End Date Taking? Authorizing Provider  amphetamine-dextroamphetamine (ADDERALL) 20 MG tablet Take 20 mg by mouth 2 (two) times daily. 07/25/19   [provider]  Buprenorphine HCl-Naloxone HCl 2-0.5 MG FILM Place 0.5 Film under the tongue daily. 08/15/19   [provider]  docusate sodium (COLACE) 100 MG capsule Take 1 capsule (100 mg total) by mouth daily as needed. 11/14/19 11/13/20  Lyndle Herrlich, MD  HYDROcodone-acetaminophen (NORCO/VICODIN) 5-325 MG tablet Take 1 tablet by mouth every 4 (four) hours as needed for moderate pain. 11/14/19 11/13/20  Lyndle Herrlich, MD  ibuprofen (ADVIL) 600 MG tablet Take 1 tablet (600 mg total) by mouth every 6 (six) hours as needed. Patient not taking: Reported on 10/30/2019 08/16/19   Dartha Lodge, PA-C  levonorgestrel (MIRENA, 52 MG,) 20 MCG/24HR IUD 1 each by Intrauterine route once.     [provider]  nitrofurantoin, macrocrystal-monohydrate, (MACROBID) 100 MG capsule Take 1 capsule (100 mg total) by mouth 2 (two) times daily. 03/06/20   Rhys Martini, PA-C  oxymetazoline (AFRIN) 0.05 % nasal spray Place 3 sprays into both nostrils 2 (two) times daily as needed for congestion.    [provider]    Family History Family History  Family history unknown: Yes  Social History Social History   Tobacco Use  . Smoking status: Former Smoker    Packs/day: 0.50    Types: Cigarettes  . Smokeless tobacco: Never Used  Vaping Use  . Vaping Use: Some days  Substance Use Topics  . Alcohol use: Yes    Alcohol/week: 0.0 standard drinks  . Drug use: No    Comment: heroin     Allergies   Patient has no known allergies.   Review of Systems Review of Systems  Gastrointestinal: Positive for abdominal pain.  Genitourinary: Positive for dysuria and frequency.  All other systems reviewed and are negative.    Physical Exam Triage Vital Signs ED Triage Vitals  Enc Vitals Group     BP 03/06/20  1842 (!) 143/66     Pulse Rate 03/06/20 1842 (!) 102     Resp 03/06/20 1842 19     Temp 03/06/20 1842 97.9 F (36.6 C)     Temp Source 03/06/20 1842 Oral     SpO2 03/06/20 1842 99 %     Weight --      Height --      Head Circumference --      Peak Flow --      Pain Score 03/06/20 1841 5     Pain Loc --      Pain Edu? --      Excl. in GC? --    No data found.  Updated Vital Signs BP (!) 143/66 (BP Location: Right Arm)   Pulse (!) 102   Temp 97.9 F (36.6 C) (Oral)   Resp 19   SpO2 99%   Visual Acuity Right Eye Distance:   Left Eye Distance:   Bilateral Distance:    Right Eye Near:   Left Eye Near:    Bilateral Near:     Physical Exam Vitals reviewed.  Constitutional:      General: She is not in acute distress.    Appearance: Normal appearance. She is not ill-appearing.  HENT:     Head: Normocephalic and atraumatic.  Cardiovascular:     Rate and Rhythm: Normal rate and regular rhythm.     Heart sounds: Normal heart sounds.  Pulmonary:     Effort: Pulmonary effort is normal.     Breath sounds: Normal breath sounds.  Abdominal:     General: Bowel sounds are normal. There is no distension.     Palpations: Abdomen is soft. There is no mass.     Tenderness: There is abdominal tenderness in the right upper quadrant. There is no right CVA tenderness, left CVA tenderness, guarding or rebound. Negative signs include Murphy's sign, Rovsing's sign and McBurney's sign.     Comments: RUQ tenderness to deep palpation  Negative murphy sign  Neurological:     General: No focal deficit present.     Mental Status: She is alert and oriented to person, place, and time. Mental status is at baseline.  Psychiatric:        Mood and Affect: Mood normal.        Behavior: Behavior normal.        Thought Content: Thought content normal.        Judgment: Judgment normal.      UC Treatments / Results  Labs (all labs ordered are listed, but only abnormal results are  displayed) Labs Reviewed  POCT URINALYSIS DIPSTICK, ED / UC - Abnormal; Notable for the following components:      Result Value   Hgb  urine dipstick MODERATE (*)    Leukocytes,Ua SMALL (*)    All other components within normal limits  URINE CULTURE  POC URINE PREG, ED    EKG   Radiology No results found.  Procedures Procedures (including critical care time)  Medications Ordered in UC Medications - No data to display  Initial Impression / Assessment and Plan / UC Course  I have reviewed the triage vital signs and the nursing notes.  Pertinent labs & imaging results that were available during my care of the patient were reviewed by me and considered in my medical decision making (see chart for details).     UA today with Hgb and small leuk. Plan to treat for UTI with Macrobid as below. Culture sent. She is afebrile, nontachycardic, nontachypneic  Return precautions- worsening of abd/back pain despite treatment, new/rising fevers, chest pain, shortness of breath, fevers, etc. Patient verbalizes understanding and agreement.  Final Clinical Impressions(s) / UC Diagnoses   Final diagnoses:  Acute cystitis with hematuria     Discharge Instructions     Start the antibiotic (Macrobid) for your UTI. You can take this with food if you have a sensitive stomach. Make sure to drink plenty of water.    ED Prescriptions    Medication Sig Dispense Auth. Provider   nitrofurantoin, macrocrystal-monohydrate, (MACROBID) 100 MG capsule Take 1 capsule (100 mg total) by mouth 2 (two) times daily. 10 capsule Rhys Martini, PA-C     PDMP not reviewed this encounter.   Rhys Martini, PA-C 03/06/20 2000

## 2020-03-06 NOTE — Discharge Instructions (Signed)
Start the antibiotic (Macrobid) for your UTI. You can take this with food if you have a sensitive stomach. Make sure to drink plenty of water.

## 2020-03-06 NOTE — ED Triage Notes (Signed)
Pt in with c/o right side abdominal pain, urinary frequency and burning that has been going on for over 1 month. Also c/o temp of 103  Denies vaginal discharge or itchiness

## 2020-03-09 LAB — URINE CULTURE: Culture: >=100000 — AB

## 2020-04-28 ENCOUNTER — Other Ambulatory Visit: Payer: Self-pay

## 2020-04-28 ENCOUNTER — Encounter (HOSPITAL_COMMUNITY): Payer: Self-pay | Admitting: *Deleted

## 2020-04-28 ENCOUNTER — Ambulatory Visit (HOSPITAL_COMMUNITY)
Admission: EM | Admit: 2020-04-28 | Discharge: 2020-04-28 | Disposition: A | Discharge status: Home / Self Care | Attending: Internal Medicine | Admitting: Internal Medicine

## 2020-04-28 DIAGNOSIS — I889 Nonspecific lymphadenitis, unspecified: Secondary | ICD-10-CM | POA: Insufficient documentation

## 2020-04-28 DIAGNOSIS — Z20822 Contact with and (suspected) exposure to covid-19: Secondary | ICD-10-CM | POA: Diagnosis not present

## 2020-04-28 DIAGNOSIS — Z87891 Personal history of nicotine dependence: Secondary | ICD-10-CM | POA: Diagnosis not present

## 2020-04-28 LAB — CBC WITH DIFFERENTIAL/PLATELET
Abs Immature Granulocytes: 0.02 10*3/uL (ref 0.00–0.07)
Basophils Absolute: 0 10*3/uL (ref 0.0–0.1)
Basophils Relative: 0 %
Eosinophils Absolute: 0.1 10*3/uL (ref 0.0–0.5)
Eosinophils Relative: 1 %
HCT: 41.8 % (ref 36.0–46.0)
Hemoglobin: 13.7 g/dL (ref 12.0–15.0)
Immature Granulocytes: 0 %
Lymphocytes Relative: 34 %
Lymphs Abs: 2.2 10*3/uL (ref 0.7–4.0)
MCH: 29.8 pg (ref 26.0–34.0)
MCHC: 32.8 g/dL (ref 30.0–36.0)
MCV: 90.9 fL (ref 80.0–100.0)
Monocytes Absolute: 0.5 10*3/uL (ref 0.1–1.0)
Monocytes Relative: 8 %
Neutro Abs: 3.6 10*3/uL (ref 1.7–7.7)
Neutrophils Relative %: 57 %
Platelets: 284 10*3/uL (ref 150–400)
RBC: 4.6 MIL/uL (ref 3.87–5.11)
RDW: 12.4 % (ref 11.5–15.5)
WBC: 6.3 10*3/uL (ref 4.0–10.5)
nRBC: 0 % (ref 0.0–0.2)

## 2020-04-28 LAB — POCT URINALYSIS DIPSTICK, ED / UC
Bilirubin Urine: NEGATIVE
Glucose, UA: NEGATIVE mg/dL
Ketones, ur: NEGATIVE mg/dL
Leukocytes,Ua: NEGATIVE
Nitrite: NEGATIVE
Protein, ur: NEGATIVE mg/dL
Specific Gravity, Urine: >=1.03 (ref 1.005–1.030)
Urobilinogen, UA: 0.2 mg/dL (ref 0.0–1.0)
pH: 5.5 (ref 5.0–8.0)

## 2020-04-28 LAB — TSH: TSH: 2.824 u[IU]/mL (ref 0.350–4.500)

## 2020-04-28 LAB — POC URINE PREG, ED: Preg Test, Ur: NEGATIVE

## 2020-04-28 LAB — BASIC METABOLIC PANEL
Anion gap: 8 (ref 5–15)
BUN: 9 mg/dL (ref 6–20)
CO2: 29 mmol/L (ref 22–32)
Calcium: 9.4 mg/dL (ref 8.9–10.3)
Chloride: 103 mmol/L (ref 98–111)
Creatinine, Ser: 0.77 mg/dL (ref 0.44–1.00)
GFR, Estimated: >60 mL/min (ref >60)
Glucose, Bld: 107 mg/dL — ABNORMAL HIGH (ref 70–99)
Potassium: 3.7 mmol/L (ref 3.5–5.1)
Sodium: 140 mmol/L (ref 135–145)

## 2020-04-28 NOTE — ED Triage Notes (Signed)
Pt reports swelling to Lt side of neck ( lymph node) .and still has Sx's of UTI.

## 2020-04-28 NOTE — Discharge Instructions (Addendum)
Warm salt water gargle Tylenol/Motrin as needed for pain We will call you with labs when results are available If symptoms worsen please return to urgent care to be reevaluated.

## 2020-04-29 LAB — SARS CORONAVIRUS 2 (TAT 6-24 HRS): SARS Coronavirus 2: NEGATIVE

## 2020-04-29 NOTE — ED Provider Notes (Signed)
[facility]    CSN: [id number] Arrival date & time: 04/28/20  1526      History   Chief Complaint Chief Complaint  Patient presents with  . Urinary Tract Infection  . Lymphadenopathy    HPI LATAJA Baker is a 35 y.o. female comes to the urgent care with 2-week history of cervical glands.  Symptoms started 2 weeks ago and has been somewhat intermittent.  It was more pronounced on the right side.  The right-sided swelling resolved and this morning patient noticed swelling on the left side of the neck.  No postnasal drip.  No fever, chills, night sweats, weight loss, postnasal drip or runny nose.  No abdominal pain, nausea or vomiting.  No rash or easy bruising.  No sick contacts.  Patient is not vaccinated against COVID-19 virus.  HPI  Past Medical History:  Diagnosis Date  . Chronic Suboxone maintenance 10/15/2014  . 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 Migraine     Patient Active Problem List   Diagnosis Date Noted  . Right anterior knee pain 11/19/2014  . Epigastric abdominal pain 11/19/2014  . 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 Chronic Suboxone maintenance 10/15/2014  . 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 TOBACCO USE 05/09/2009  . HEADACHE, CHRONIC 05/09/2009  . REDUCTION MAMMOPLASTY, HX OF 05/09/2009    Past Surgical History:  Procedure Laterality Date  . BREAST SURGERY     reduction in 2010  . KNEE ARTHROSCOPY WITH ANTERIOR CRUCIATE LIGAMENT (ACL) REPAIR Right 11/14/2019   Procedure: Right knee ACL Reconstruction with Allograft, Partial Medial Meniscectomy, Partial Synovectomy;  Surgeon: Lyndle Herrlich, MD;  Location: [facility];  Service: Orthopedics;  Laterality: Right;    OB History    Gravida  1   Para  0   Term  0   Preterm  0   AB  0   Living  0     SAB  0   IAB  0   Ectopic  0   Multiple  0   Live Births               Home Medications    Prior to Admission  medications   Medication Sig Start Date End Date Taking? Authorizing Provider  amphetamine-dextroamphetamine (ADDERALL) 20 MG tablet Take 20 mg by mouth 2 (two) times daily. 07/25/19   [provider]  Buprenorphine HCl-Naloxone HCl 2-0.5 MG FILM Place 0.5 Film under the tongue daily. 08/15/19   [provider]  docusate sodium (COLACE) 100 MG capsule Take 1 capsule (100 mg total) by mouth daily as needed. 11/14/19 11/13/20  Lyndle Herrlich, MD  HYDROcodone-acetaminophen (NORCO/VICODIN) 5-325 MG tablet Take 1 tablet by mouth every 4 (four) hours as needed for moderate pain. 11/14/19 11/13/20  Lyndle Herrlich, MD  levonorgestrel (MIRENA, 52 MG,) 20 MCG/24HR IUD 1 each by Intrauterine route once.     [provider]  nitrofurantoin, macrocrystal-monohydrate, (MACROBID) 100 MG capsule Take 1 capsule (100 mg total) by mouth 2 (two) times daily. 03/06/20   Rhys Martini, PA-C  oxymetazoline (AFRIN) 0.05 % nasal spray Place 3 sprays into both nostrils 2 (two) times daily as needed for congestion.    [provider]    Family History Family History  Family history unknown: Yes    Social History Social History   Tobacco Use  . Smoking status: Former Smoker    Packs/day: 0.50  Types: Cigarettes  . Smokeless tobacco: Never Used  Vaping Use  . Vaping Use: Some days  Substance Use Topics  . Alcohol use: Yes    Alcohol/week: 0.0 standard drinks  . Drug use: No    Comment: heroin     Allergies   Patient has no known allergies.   Review of Systems Review of Systems  HENT: Negative for facial swelling, mouth sores, nosebleeds, rhinorrhea, sinus pressure, sinus pain, trouble swallowing and voice change.   Neurological: Negative.      Physical Exam Triage Vital Signs ED Triage Vitals  Enc Vitals Group     BP 04/28/20 1547 (!) 127/96     Pulse Rate 04/28/20 1547 72     Resp 04/28/20 1547 16     Temp 04/28/20 1547 98.5 F (36.9 C)     Temp Source  04/28/20 1547 Oral     SpO2 04/28/20 1547 100 %     Weight --      Height --      Head Circumference --      Peak Flow --      Pain Score 04/28/20 1544 3     Pain Loc --      Pain Edu? --      Excl. in GC? --    No data found.  Updated Vital Signs BP (!) 127/96 (BP Location: Right Arm)   Pulse 72   Temp 98.5 F (36.9 C) (Oral)   Resp 16   LMP 04/28/2020   SpO2 100%   Visual Acuity Right Eye Distance:   Left Eye Distance:   Bilateral Distance:    Right Eye Near:   Left Eye Near:    Bilateral Near:     Physical Exam Vitals and nursing note reviewed.  Constitutional:      General: She is not in acute distress.    Appearance: She is not ill-appearing.  HENT:     Mouth/Throat:     Mouth: Mucous membranes are moist.     Pharynx: No posterior oropharyngeal erythema.  Eyes:     General:        Left eye: No discharge.     Pupils: Pupils are equal, round, and reactive to light.  Cardiovascular:     Rate and Rhythm: Normal rate and regular rhythm.     Pulses: Normal pulses.  Pulmonary:     Effort: Pulmonary effort is normal.     Breath sounds: Normal breath sounds.  Musculoskeletal:     Cervical back: Normal range of motion. No rigidity or tenderness.  Neurological:     Mental Status: She is alert.      UC Treatments / Results  Labs (all labs ordered are listed, but only abnormal results are displayed) Labs Reviewed  BASIC METABOLIC PANEL - Abnormal; Notable for the following components:      Result Value   Glucose, Bld 107 (*)    All other components within normal limits  POCT URINALYSIS DIPSTICK, ED / UC - Abnormal; Notable for the following components:   Hgb urine dipstick TRACE (*)    All other components within normal limits  SARS CORONAVIRUS 2 (TAT 6-24 HRS)  CBC WITH DIFFERENTIAL/PLATELET  TSH  POC URINE PREG, ED    EKG   Radiology No results found.  Procedures Procedures (including critical care time)  Medications Ordered in  UC Medications - No data to display  Initial Impression / Assessment and Plan / UC Course  I have reviewed  the triage vital signs and the nursing notes.  Pertinent labs & imaging results that were available during my care of the patient were reviewed by me and considered in my medical decision making (see chart for details).     1.  Cervical lymphadenitis: CBC, BMP, TSH COVID-19 PCR test Patient requested urine test for UTI.  Point-of-care urinalysis is not remarkable for UTI. We will call patient with results Warm salt water gargle for sore throat Tylenol Motrin as needed for pain and/or fever Return precautions given. Final Clinical Impressions(s) / UC Diagnoses   Final diagnoses:  Cervical lymphadenitis     Discharge Instructions     Warm salt water gargle Tylenol/Motrin as needed for pain We will call you with labs when results are available If symptoms worsen please return to urgent care to be reevaluated.   ED Prescriptions    None     PDMP not reviewed this encounter.   Merrilee Jansky, MD 04/29/20 [phone]

## 2021-11-23 ENCOUNTER — Emergency Department (HOSPITAL_COMMUNITY)
Admission: EM | Admit: 2021-11-23 | Discharge: 2021-11-23 | Disposition: A | Discharge status: Home / Self Care | Attending: Student | Admitting: Student

## 2021-11-23 ENCOUNTER — Encounter (HOSPITAL_COMMUNITY): Payer: Self-pay | Admitting: Emergency Medicine

## 2021-11-23 ENCOUNTER — Other Ambulatory Visit: Payer: Self-pay

## 2021-11-23 DIAGNOSIS — S6991XA Unspecified injury of right wrist, hand and finger(s), initial encounter: Secondary | ICD-10-CM | POA: Diagnosis present

## 2021-11-23 DIAGNOSIS — W260XXA Contact with knife, initial encounter: Secondary | ICD-10-CM | POA: Diagnosis not present

## 2021-11-23 DIAGNOSIS — S61210A Laceration without foreign body of right index finger without damage to nail, initial encounter: Secondary | ICD-10-CM | POA: Diagnosis not present

## 2021-11-23 MED ORDER — LIDOCAINE HCL (PF) 1 % IJ SOLN
30.0000 mL | Freq: Once | INTRAMUSCULAR | Status: AC
  Administered 2021-11-23: 30 mL
  Filled 2021-11-23: qty 30

## 2021-11-23 NOTE — ED Triage Notes (Signed)
Patient accidentally hit by a knife at home this evening , reports skin laceration at right middle index finger , bleeding controlled , dressing applied at home .

## 2021-11-23 NOTE — ED Provider Notes (Signed)
[facility] Emergency Department Provider Note MRN:  [id number]  Arrival date & time: 11/23/21     Chief Complaint   Finger Laceration    History of Present Illness   Darlene Baker is a 36 y.o. year-old female presents to the ED with chief complaint of laceration to right index finger.  She states that she accidentally cut herself with a sharp knife.  She was fixing dinner when the injury occurred.  She states that she has had some persistent bleeding.  She denies weakness in the finger, but states that it does feel a bit numb.  History provided by patient.   Review of Systems  Pertinent positive and negative review of systems noted in HPI.    Physical Exam   Vitals:   11/23/21 2210  BP: (!) 126/100  Pulse: 84  Resp: 16  Temp: 98.6 F (37 C)  SpO2: 100%    CONSTITUTIONAL:  well-appearing, NAD NEURO:  Alert and oriented x 3, CN 3-12 grossly intact EYES:  eyes equal and reactive ENT/NECK:  Supple, no stridor  CARDIO:  appears well-perfused, normal cap refill PULM:  No respiratory distress,  GI/GU:  non-distended,  MSK/SPINE: 1 cm laceration to right index finger as pictured, no apparent deep structural involvement, normal range of motion and strength of the finger, the PIP joint is stable SKIN:  no rash, atraumatic    *Additional and/or pertinent findings included in MDM below  Diagnostic and Interventional Summary     Labs Reviewed - No data to display  No orders to display    Medications  lidocaine (PF) (XYLOCAINE) 1 % injection 30 mL (30 mLs Infiltration Given 11/23/21 2233)     Procedures  /  Critical Care .Marland KitchenLaceration Repair  Date/Time: 11/23/2021 10:23 PM  Performed by: Roxy Horseman, PA-C Authorized by: Roxy Horseman, PA-C   Consent:    Consent obtained:  Verbal   Consent given by:  Patient   Risks discussed:  Infection, need for additional repair, pain, poor cosmetic result and poor wound healing   Alternatives  discussed:  No treatment and delayed treatment Universal protocol:    Procedure explained and questions answered to patient or proxy's satisfaction: yes     Relevant documents present and verified: yes     Test results available: yes     Imaging studies available: yes     Required blood products, implants, devices, and special equipment available: yes     Site/side marked: yes     Immediately prior to procedure, a time out was called: yes     Patient identity confirmed:  Verbally with patient Anesthesia:    Anesthesia method:  Local infiltration   Local anesthetic:  Lidocaine 1% w/o epi Laceration details:    Location:  Finger   Finger location:  R index finger   Length (cm):  1 Pre-procedure details:    Preparation:  Patient was prepped and draped in usual sterile fashion Exploration:    Wound extent: no tendon damage noted     Contaminated: no   Treatment:    Area cleansed with:  Povidone-iodine and saline Skin repair:    Repair method:  Sutures   Suture size:  4-0   Wound skin closure material used: vicryl.   Number of sutures:  2 Approximation:    Approximation:  Close Repair type:    Repair type:  Simple Post-procedure details:    Dressing:  Splint for protection   Procedure completion:  Tolerated well, no  immediate complications   ED Course and Medical Decision Making  I have reviewed the triage vital signs, the nursing notes, and pertinent available records from the EMR.  Social Determinants Affecting Complexity of Care: Patient has no clinically significant social determinants affecting this chief complaint..   ED Course:    Medical Decision Making Patient here with minor skin laceration from a knife.  Laceration repaired with sutures.  Wound was thoroughly irrigated.  No evidence of tendon or ligament injury.  Risk Prescription drug management.     Consultants: No consultations were needed in caring for this patient.   Treatment and  Plan: Emergency department workup does not suggest an emergent condition requiring admission or immediate intervention beyond  what has been performed at this time. The patient is safe for discharge and has  been instructed to return immediately for worsening symptoms, change in  symptoms or any other concerns    Final Clinical Impressions(s) / ED Diagnoses     ICD-10-CM   1. Laceration of right index finger without foreign body without damage to nail, initial encounter  P95.093O       ED Discharge Orders     None         Discharge Instructions Discussed with and Provided to Patient:   Discharge Instructions   None      Roxy Horseman, PA-C 11/23/21 2257    Glendora Score, MD 11/24/21 1223

## 2022-06-19 NOTE — Progress Notes (Signed)
 CHIEF COMPLAINT:   Chief Complaint  Patient presents with  . Facial Pain     mucus, ear stocked up, protective cough for 1 week, sinus pressure, headache. No fever, but has had body aches, cold sweats and fatigue. Meds for headache.     HPI:  Darlene Baker is a 37 y.o. female who presents to the urgent care today with 8 days of congestion, postnasal drainage, cough, intermittent headache.  When symptoms initially started she was experiencing chills, sweats, generalized body aches, nausea, and sinus pain.  Came to the urgent care and had COVID and strep testing which came back negative.  She left prior to seeing a provider due to wait time.  Started using a leftover prescription of amoxicillin , which she has been taking all week without improvement.  Sinus pain did resolve, as well as the generalized body aches, nausea, chills/sweats.  She is continuing to have postnasal drainage and fatigue with occasional cough.  No shortness of breath or wheezing.  She is experiencing intermittent pain and pressure in both ears.  Possible history of environmental allergies but was taking no allergy medication until she started taking generic Allegra this week.  Intermittently using Afrin throughout the week.  No other nasal sprays.  Taking no other over-the-counter medication for symptoms. Hx of smoking, quit 4 yrs ago. No hx of asthma. No recent travel.  PAST MEDICAL HISTORY:   Past Medical History:  Diagnosis Date  . ADD (attention deficit disorder)   . Hypertension   . Substance abuse (CMS/HHS-HCC)     CURRENT PROBLEM LIST:   Patient Active Problem List  Diagnosis  . Atypical squamous cell changes of cervix undetermined significance favor benign  . Benign neoplasm of skin  . 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 Somnolence  . Encounter for monitoring Suboxone maintenance therapy  . Epigastric abdominal pain  . Fatigue  . Fetal and placental problem in pregnancy, antepartum (HHS-HCC)  . Fetal  growth restriction  . Gastroschisis, fetal, affecting care of mother, antepartum (HHS-HCC)  . Headache(784.0)  . Health examination of defined subpopulation  . Heroin addiction (CMS/HHS-HCC)  . History of reconstruction of anterior cruciate ligament tear  . Lower back pain  . Monochorionic diamniotic twin gestation (HHS-HCC)  . Nicotine dependence  . Other symptoms involving head and neck  . Pain in thoracic spine  . Post partum depression  . Right anterior knee pain  . Screening examination for pulmonary tuberculosis  . Strain of knee  . Tobacco use disorder  . Unspecified personal history presenting hazards to health   CURRENT MEDICATIONS:   Current Outpatient Medications:  .  aspirin/acetaminophen /caffeine (GOODY'S EXTRA STRENGTH ORAL), Take by mouth, Disp: , Rfl:  .  buprenorphine-naloxone (SUBOXONE) 2-0.5 mg SL film, PLACE 1 FILM SUBLINGUALLY EVERY DAY AS NEEDED, Disp: , Rfl:  .  dextroamphetamine-amphetamine (ADDERALL) 20 mg tablet, , Disp: , Rfl:  .  oxymetazoline (AFRIN) 0.05 % nasal spray, Place into one nostril, Disp: , Rfl:  .  cefdinir (OMNICEF) 300 mg capsule, Take 1 capsule (300 mg total) by mouth every 12 (twelve) hours for 7 days, Disp: 14 capsule, Rfl: 0  ALLERGIES:  No Known Allergies SOCIAL HISTORY:   Social History   Tobacco Use  . Smoking status: Never  . Smokeless tobacco: Never  Vaping Use  . Vaping Use: Every day  Substance Use Topics  . Alcohol use: Yes    Comment: not regularly    ROS:  Review of Systems  Constitutional:  Positive for chills, diaphoresis and fatigue. Negative for fever.  HENT:  Positive for congestion, postnasal drip, rhinorrhea, sinus pressure, sinus pain (improved) and sore throat (improved).   Respiratory:  Positive for cough. Negative for shortness of breath and wheezing.   Gastrointestinal:  Positive for nausea. Negative for diarrhea and vomiting.  Musculoskeletal:  Positive for myalgias (resolved).   Allergic/Immunologic: Positive for environmental allergies (?). Negative for immunocompromised state.  Neurological:  Positive for light-headedness (just now) and headaches.      PHYSICAL EXAM:   Vitals:   06/19/22 0916  BP: 123/84  Pulse: 94  Resp: 15  Temp: 36.7 C (98.1 F)  SpO2: 100%  Weight: 64.4 kg (141 lb 15.6 oz)  PainSc:   4   Patient's last menstrual period was 06/18/2022 (exact date).  Physical Exam Vitals and nursing note reviewed.  Constitutional:      General: She is not in acute distress.    Appearance: She is ill-appearing. She is not toxic-appearing or diaphoretic.  HENT:     Right Ear: Tympanic membrane, ear canal and external ear normal.     Left Ear: Tympanic membrane, ear canal and external ear normal.     Nose: Mucosal edema and congestion present. No rhinorrhea.     Right Turbinates: Swollen.     Left Turbinates: Swollen.     Right Sinus: No maxillary sinus tenderness or frontal sinus tenderness.     Left Sinus: No maxillary sinus tenderness or frontal sinus tenderness.     Mouth/Throat:     Pharynx: Uvula midline. Posterior oropharyngeal erythema present. No oropharyngeal exudate.  Eyes:     General: Lids are normal.     Conjunctiva/sclera: Conjunctivae normal.  Neck:     Trachea: Phonation normal. No tracheal tenderness.  Cardiovascular:     Rate and Rhythm: Normal rate and regular rhythm.     Heart sounds: Normal heart sounds. No murmur heard. Pulmonary:     Effort: Pulmonary effort is normal.     Breath sounds: Normal breath sounds and air entry.  Musculoskeletal:     Cervical back: Neck supple. No edema or rigidity.  Lymphadenopathy:     Cervical: No cervical adenopathy.     Upper Body:     Right upper body: No supraclavicular adenopathy.     Left upper body: No supraclavicular adenopathy.  Skin:    General: Skin is warm and dry.     Findings: No erythema or rash.  Neurological:     Mental Status: She is alert and oriented to  person, place, and time. She is not lethargic.     Gait: Gait is intact.  Psychiatric:        Attention and Perception: Attention normal.        Mood and Affect: Mood and affect normal.        Speech: Speech normal.        Behavior: Behavior normal.        Thought Content: Thought content normal.      LABS/X-RAYS/EKG/MEDS:  No results found for this visit on 06/19/22.  ASSESSMENT/PLAN:  Diagnoses and all orders for this visit:  Acute bacterial sinusitis -     cefdinir (OMNICEF) 300 mg capsule; Take 1 capsule (300 mg total) by mouth every 12 (twelve) hours for 7 days   Likely viral illness with gradually improving symptoms.  Recommended she continue the allergy medication just in case there is a component of allergy related symptoms with the high pollen counts currently.  She was provided an antibiotic and recommended holding on to the prescription in the event that symptoms are worsening, or not improving, but no indication to start the medication at this time. Recommended Mucinex DM (generic ok) and Flonase .  Advised to discontinue the use of Afrin and to discard the bottle to avoid reinfection in the future.  OTC medication for pain or fever, if needed. Discussed drinking plenty of fluids to stay hydrated, including electrolytes if appetite is suppressed. Reviewed signs and symptoms that would indicate need for further evaluation, including those that may be emergent.      Lolita Scrivener, DO [facility] Physician   I spent a total of 35 minutes in both face-to-face and non-face-to-face activities for this visit on the date of this encounter.

## 2022-07-09 ENCOUNTER — Ambulatory Visit: Admit: 2022-07-09 | Discharge status: Against Medical Advice

## 2023-07-30 NOTE — Progress Notes (Signed)
 Pain in left ear x 1 day

## 2023-07-31 NOTE — Progress Notes (Signed)
 Subjective Patient ID: Darlene Baker is a 38 y.o. female.  Pt with left ear pain, has mild sore throat. Recent URI, no recent swimming. No fever, hearing loss or ear drainage. No hx of allergies.   Earache  There is pain in the left ear. This is a recurrent problem. The current episode started yesterday. The problem occurs constantly. The problem has been gradually worsening. There has been no fever. The pain is at a severity of 4/10. Associated symptoms include diarrhea, headaches and a sore throat. Pertinent negatives include no abdominal pain, coughing, ear discharge, hearing loss, neck pain, rash, rhinorrhea or vomiting.    Review of Systems  HENT:  Positive for ear pain and sore throat. Negative for ear discharge, hearing loss and rhinorrhea.   Respiratory:  Negative for cough.   Gastrointestinal:  Positive for diarrhea. Negative for abdominal pain and vomiting.  Musculoskeletal:  Negative for neck pain.  Skin:  Negative for rash.  Neurological:  Positive for headaches.    Patient History  Allergies: No Known Allergies   Past Medical History:  Diagnosis Date  . ADHD   . Opioid abuse with withdrawal (CMS/HCC)    History reviewed. No pertinent surgical history. Social History   Socioeconomic History  . Marital status: Married    Spouse name: Not on file  . Number of children: Not on file  . Years of education: Not on file  . Highest education level: Not on file  Occupational History  . Not on file  Tobacco Use  . Smoking status: Never  . Smokeless tobacco: Never  Vaping Use  . Vaping status: Some Days  Substance and Sexual Activity  . Alcohol use: Yes  . Drug use: Not on file  . Sexual activity: Yes  Other Topics Concern  . Not on file  Social History Narrative  . Not on file   No family history on file. Current Outpatient Medications on File Prior to Visit  Medication Sig Dispense Refill  . amphetamine-dextroamphetamine (Adderall) 20 MG tablet  dextroamphetamine-amphetamine 20 mg tablet    . buprenorphine-naloxone (Suboxone) 8-2 MG SL tablet Place 1.5 tablets under the tongue.    SABRA ipratropium (Atrovent) 0.06 % nasal spray Administer 2 sprays into each nostril in the morning and 2 sprays in the evening and 2 sprays before bedtime. (Patient not taking: Reported on 07/30/2023) 15 mL 0  . levonorgestrel (Mirena, 52 MG,) 20 MCG/24HR IUD Mirena 20 mcg/24 hours (6 yrs) 52 mg intrauterine device  Take 1 device by intrauterine route.     No current facility-administered medications on file prior to visit.     Objective  Vitals:   07/30/23 1532  BP: 130/82  Pulse: 87  Resp: 19  Temp: 36.9 C (98.4 F)  TempSrc: Tympanic  SpO2: 98%  Weight: 64 kg  Height: 5' 3  PainSc: 0-No pain  LMP: 07/27/2023                No results found.  Physical Exam Constitutional:      Appearance: Normal appearance.  HENT:     Right Ear: Tympanic membrane normal.     Left Ear: Hearing and ear canal normal. Tympanic membrane is erythematous.     Mouth/Throat:     Mouth: Mucous membranes are moist.     Pharynx: Posterior oropharyngeal erythema present. No oropharyngeal exudate.  Cardiovascular:     Rate and Rhythm: Normal rate and regular rhythm.     Heart sounds: Normal heart sounds.  Pulmonary:     Effort: Pulmonary effort is normal.     Breath sounds: Normal breath sounds.  Neurological:     Mental Status: She is alert and oriented to person, place, and time.      No results found for this visit on 07/30/23.   Procedures MDM:     1 Acute, uncomplicated illness or injury     Assessment requiring historian other than patient: No     Independent visualization of image, tracing, or test: No     Discussion of management with another provider: No     Risk:: Moderate          Assessment/Plan Diagnoses and all orders for this visit:  Other acute nonsuppurative otitis media of left ear, recurrence not specified  Other  orders -     amoxicillin  (Amoxil ) 500 MG capsule; Take 1 capsule (500 mg total) by mouth every 8 (eight) hours for 7 days.      Disposition Status: Home  Progress note signed by Terry Lute, NP on 07/31/23 at 12:27 PM

## 2023-10-16 ENCOUNTER — Other Ambulatory Visit: Payer: Self-pay

## 2023-10-16 ENCOUNTER — Emergency Department
Admission: EM | Admit: 2023-10-16 | Discharge: 2023-10-16 | Disposition: A | Discharge status: Home / Self Care | Attending: Emergency Medicine | Admitting: Emergency Medicine

## 2023-10-16 DIAGNOSIS — R112 Nausea with vomiting, unspecified: Secondary | ICD-10-CM | POA: Insufficient documentation

## 2023-10-16 DIAGNOSIS — T887XXA Unspecified adverse effect of drug or medicament, initial encounter: Secondary | ICD-10-CM

## 2023-10-16 DIAGNOSIS — T50905A Adverse effect of unspecified drugs, medicaments and biological substances, initial encounter: Secondary | ICD-10-CM | POA: Insufficient documentation

## 2023-10-16 DIAGNOSIS — S1091XA Abrasion of unspecified part of neck, initial encounter: Secondary | ICD-10-CM | POA: Insufficient documentation

## 2023-10-16 DIAGNOSIS — W540XXA Bitten by dog, initial encounter: Secondary | ICD-10-CM | POA: Diagnosis not present

## 2023-10-16 LAB — CBC
HCT: 37.8 % (ref 36.0–46.0)
Hemoglobin: 12.6 g/dL (ref 12.0–15.0)
MCH: 30.3 pg (ref 26.0–34.0)
MCHC: 33.3 g/dL (ref 30.0–36.0)
MCV: 90.9 fL (ref 80.0–100.0)
Platelets: 322 K/uL (ref 150–400)
RBC: 4.16 MIL/uL (ref 3.87–5.11)
RDW: 12.2 % (ref 11.5–15.5)
WBC: 6 K/uL (ref 4.0–10.5)
nRBC: 0 % (ref 0.0–0.2)

## 2023-10-16 LAB — COMPREHENSIVE METABOLIC PANEL WITH GFR
ALT: 16 U/L (ref 0–44)
AST: 22 U/L (ref 15–41)
Albumin: 4.6 g/dL (ref 3.5–5.0)
Alkaline Phosphatase: 30 U/L — ABNORMAL LOW (ref 38–126)
Anion gap: 7 (ref 5–15)
BUN: 9 mg/dL (ref 6–20)
CO2: 26 mmol/L (ref 22–32)
Calcium: 9.4 mg/dL (ref 8.9–10.3)
Chloride: 104 mmol/L (ref 98–111)
Creatinine, Ser: 0.86 mg/dL (ref 0.44–1.00)
GFR, Estimated: >60 mL/min (ref >60)
Glucose, Bld: 90 mg/dL (ref 70–99)
Potassium: 3.9 mmol/L (ref 3.5–5.1)
Sodium: 137 mmol/L (ref 135–145)
Total Bilirubin: 1 mg/dL (ref 0.0–1.2)
Total Protein: 7.4 g/dL (ref 6.5–8.1)

## 2023-10-16 LAB — URINALYSIS, ROUTINE W REFLEX MICROSCOPIC
Bilirubin Urine: NEGATIVE
Glucose, UA: NEGATIVE mg/dL
Hgb urine dipstick: NEGATIVE
Ketones, ur: NEGATIVE mg/dL
Leukocytes,Ua: NEGATIVE
Nitrite: NEGATIVE
Protein, ur: NEGATIVE mg/dL
Specific Gravity, Urine: 1.013 (ref 1.005–1.030)
pH: 8 (ref 5.0–8.0)

## 2023-10-16 LAB — POC URINE PREG, ED: Preg Test, Ur: NEGATIVE

## 2023-10-16 LAB — LIPASE, BLOOD: Lipase: 34 U/L (ref 11–51)

## 2023-10-16 MED ORDER — AMOXICILLIN-POT CLAVULANATE 875-125 MG PO TABS: 1.0000 | ORAL_TABLET | Freq: Two times a day (BID) | ORAL | 0 refills | Status: AC

## 2023-10-16 MED ORDER — PANTOPRAZOLE SODIUM 40 MG IV SOLR
40.0000 mg | Freq: Once | INTRAVENOUS | Status: AC
  Administered 2023-10-16: 40 mg via INTRAVENOUS
  Filled 2023-10-16: qty 10

## 2023-10-16 MED ORDER — ONDANSETRON 4 MG PO TBDP: 4.0000 mg | ORAL_TABLET | Freq: Three times a day (TID) | ORAL | 0 refills | Status: AC | PRN

## 2023-10-16 MED ORDER — SODIUM CHLORIDE 0.9 % IV SOLN: Freq: Once | INTRAVENOUS | Status: AC

## 2023-10-16 MED ORDER — OMEPRAZOLE MAGNESIUM 20 MG PO TBEC: 20.0000 mg | DELAYED_RELEASE_TABLET | Freq: Every day | ORAL | 0 refills | Status: AC

## 2023-10-16 MED ORDER — ONDANSETRON HCL 4 MG/2ML IJ SOLN
4.0000 mg | Freq: Once | INTRAMUSCULAR | Status: AC
  Administered 2023-10-16: 4 mg via INTRAVENOUS
  Filled 2023-10-16: qty 2

## 2023-10-16 NOTE — Discharge Instructions (Addendum)
 You were seen in the emergency department with nausea and vomiting after inappropriately taking GLP-1 medication.  It is not recommended that you take medications that you buy off the Internet and that are not prescribed to you as they can have adverse effects.  Stay hydrated and drink plenty of fluids.  You are given a prescription for nausea medication and an acid reducing medication.  Return to the emergency department for any worsening symptoms.  You are given an antibiotic for dog bite that you had on Tuesday.  Return for any signs of an infection

## 2023-10-16 NOTE — ED Provider Notes (Signed)
 [facility] Provider Note    Event Date/Time   First MD Initiated Contact with Patient 10/16/23 0701     (approximate)   History   Emesis   HPI  Darlene Baker is a 38 y.o. female past medical history significant for Suboxone and Adderall use, who presents to the emergency department with nausea and vomiting.  States that she started to not feel well last night after giving her an self an injection of GLP-1.  Multiple episodes of vomiting and some bright red blood with vomiting.  Denies any blood in her stool.  Not on any anticoagulation.  No similar episodes in the past.  Denies any significant abdominal pain at this time.  States that she has not prescribed a GLP-1 and that she gets this off of peptide website.  Denies concern for pregnancy.  No prior abdominal surgery.  Denies any urinary symptoms.  Also inquiring about a dog bite to her neck that occurred on Tuesday and asking if it looked infected.     Physical Exam   Triage Vital Signs: ED Triage Vitals  Encounter Vitals Group     BP 10/16/23 0627 (!) 147/104     Girls Systolic BP Percentile --      Girls Diastolic BP Percentile --      Boys Systolic BP Percentile --      Boys Diastolic BP Percentile --      Pulse Rate 10/16/23 0627 88     Resp 10/16/23 0627 15     Temp 10/16/23 0627 98.5 F (36.9 C)     Temp Source 10/16/23 0627 Oral     SpO2 10/16/23 0627 100 %     Weight 10/16/23 0628 140 lb (63.5 kg)     Height 10/16/23 0628 5' 3 (1.6 m)     Head Circumference --      Peak Flow --      Pain Score 10/16/23 0627 5     Pain Loc --      Pain Education --      Exclude from Growth Chart --     Most recent vital signs: Vitals:   10/16/23 0645 10/16/23 0700  BP: 121/83 (!) 121/91  Pulse:  82  Resp:    Temp:    SpO2:  100%    Physical Exam Constitutional:      Appearance: She is well-developed.  HENT:     Head: Atraumatic.  Eyes:     Conjunctiva/sclera: Conjunctivae normal.   Neck:     Comments: Abrasion to the right neck with no surrounding erythema warmth or induration.  No crepitance. Cardiovascular:     Rate and Rhythm: Regular rhythm.  Pulmonary:     Effort: No respiratory distress.  Abdominal:     General: There is no distension.     Tenderness: There is no abdominal tenderness. There is no right CVA tenderness or left CVA tenderness.  Musculoskeletal:        General: Normal range of motion.     Cervical back: Normal range of motion.  Skin:    General: Skin is warm.     Capillary Refill: Capillary refill takes less than 2 seconds.  Neurological:     Mental Status: She is alert. Mental status is at baseline.     IMPRESSION / MDM / ASSESSMENT AND PLAN / ED COURSE  I reviewed the triage vital signs and the nursing notes.  Differential diagnosis including dehydration, Mallory-Weiss tear, gastritis/PUD, adverse  effect of medication of GLP-1, electrolyte abnormality  EKG  I, Clotilda Punter, the attending physician, personally viewed and interpreted this ECG.   Rate: Normal  Rhythm: Normal sinus  Axis: Normal  Intervals: Normal  ST&T Change: None  No tachycardic or bradycardic dysrhythmias while on cardiac telemetry.  LABS (all labs ordered are listed, but only abnormal results are displayed) Labs interpreted as -    Labs Reviewed  COMPREHENSIVE METABOLIC PANEL WITH GFR - Abnormal; Notable for the following components:      Result Value   Alkaline Phosphatase 30 (*)    All other components within normal limits  URINALYSIS, ROUTINE W REFLEX MICROSCOPIC - Abnormal; Notable for the following components:   Color, Urine YELLOW (*)    APPearance CLEAR (*)    All other components within normal limits  LIPASE, BLOOD  CBC  POC URINE PREG, ED     MDM  Abdomen nontender to palpation have a low suspicion for perforated peptic ulcer, acute cholecystitis, acute appendicitis or small bowel obstruction.  Likely side effect of GLP-1 which is not  prescribed to her.  Patient was given IV fluids and antiemetics with Zofran .  On reevaluation patient is feeling much better.  Able to tolerate p.o.  EKG without prolonged QTc.  Given IV Protonix .  Patient is full to tolerate p.o.  Most likely with Mallory-Weiss tear.  Have a low suspicion for Boerhaave's.  Is otherwise well-appearing.  Not on any anticoagulation.  Hemoglobin is stable.  Normal BUN.  Have low suspicion for significant upper GI bleed.  Lab work overall reassuring.  Pregnancy test is negative.  Will start the patient on antibiotic for her dog bite and given a prescription for Augmentin .  Does not appear infected at this time.  Given a prescription for antiemetics and omeprazole .  Discussed close follow-up with primary care physician.  Discouraged from buying medications off the Internet and taking medications not prescribed to her.  Given return precautions for any return of symptoms or new worsening symptoms.     PROCEDURES:  Critical Care performed: No  Procedures  Patient's presentation is most consistent with acute presentation with potential threat to life or bodily function.   MEDICATIONS ORDERED IN ED: Medications  ondansetron  (ZOFRAN ) injection 4 mg (4 mg Intravenous Given 10/16/23 0641)  0.9 %  sodium chloride  infusion (0 mLs Intravenous Stopped 10/16/23 0748)  pantoprazole  (PROTONIX ) injection 40 mg (40 mg Intravenous Given 10/16/23 0745)    FINAL CLINICAL IMPRESSION(S) / ED DIAGNOSES   Final diagnoses:  Nausea and vomiting, unspecified vomiting type  Medication side effect  Dog bite, initial encounter     Rx / DC Orders   ED Discharge Orders          Ordered    ondansetron  (ZOFRAN -ODT) 4 MG disintegrating tablet  Every 8 hours PRN        10/16/23 0734    amoxicillin -clavulanate (AUGMENTIN ) 875-125 MG tablet  2 times daily        10/16/23 0734    omeprazole  (PRILOSEC  OTC) 20 MG tablet  Daily        10/16/23 0734             Note:  This document  was prepared using Dragon voice recognition software and may include unintentional dictation errors.   Punter Clotilda, MD 10/16/23 [phone]

## 2023-10-16 NOTE — ED Triage Notes (Signed)
 Patient reports nausea and vomiting that started last night. States this morning there was bright red blood in her vomit. Reports abdominal pain all over her stomach. Unable to take medication due to vomiting. Patient states she has been trying to wean herself off of her suboxone. Patient took 2mg  GLP-1 and trazodone last night to help with the symptoms.
